# Patient Record
Sex: Male | Born: 1970 | Race: Black or African American | Hispanic: No | Marital: Single | Smoking: Current every day smoker
Health system: Southern US, Community
[De-identification: ages and names within clinical notes are randomized; demographics above are authoritative.]

## PROBLEM LIST (undated history)

## (undated) DIAGNOSIS — J45909 Unspecified asthma, uncomplicated: Secondary | ICD-10-CM

## (undated) DIAGNOSIS — I499 Cardiac arrhythmia, unspecified: Secondary | ICD-10-CM

## (undated) DIAGNOSIS — I4891 Unspecified atrial fibrillation: Secondary | ICD-10-CM

## (undated) DIAGNOSIS — J9819 Other pulmonary collapse: Secondary | ICD-10-CM

## (undated) DIAGNOSIS — R319 Hematuria, unspecified: Secondary | ICD-10-CM

## (undated) DIAGNOSIS — Z87442 Personal history of urinary calculi: Secondary | ICD-10-CM

## (undated) DIAGNOSIS — N200 Calculus of kidney: Secondary | ICD-10-CM

## (undated) DIAGNOSIS — J189 Pneumonia, unspecified organism: Secondary | ICD-10-CM

## (undated) HISTORY — PX: CHEST TUBE INSERTION: SHX231

## (undated) HISTORY — DX: Hematuria, unspecified: R31.9

## (undated) HISTORY — DX: Calculus of kidney: N20.0

---

## 2003-05-06 ENCOUNTER — Emergency Department (HOSPITAL_COMMUNITY): Admission: EM | Admit: 2003-05-06 | Discharge: 2003-05-06 | Payer: Self-pay | Admitting: Emergency Medicine

## 2003-12-01 ENCOUNTER — Emergency Department (HOSPITAL_COMMUNITY): Admission: EM | Admit: 2003-12-01 | Discharge: 2003-12-01 | Payer: Self-pay | Admitting: Emergency Medicine

## 2008-08-30 ENCOUNTER — Emergency Department (HOSPITAL_COMMUNITY): Admission: EM | Admit: 2008-08-30 | Discharge: 2008-08-30 | Payer: Self-pay | Admitting: Emergency Medicine

## 2012-01-08 ENCOUNTER — Encounter (HOSPITAL_COMMUNITY): Payer: Self-pay | Admitting: *Deleted

## 2012-01-08 ENCOUNTER — Emergency Department (HOSPITAL_COMMUNITY)
Admission: EM | Admit: 2012-01-08 | Discharge: 2012-01-08 | Disposition: A | Discharge status: Home / Self Care | Payer: Self-pay | Attending: Emergency Medicine | Admitting: Emergency Medicine

## 2012-01-08 DIAGNOSIS — F17200 Nicotine dependence, unspecified, uncomplicated: Secondary | ICD-10-CM | POA: Insufficient documentation

## 2012-01-08 DIAGNOSIS — R1030 Lower abdominal pain, unspecified: Secondary | ICD-10-CM

## 2012-01-08 DIAGNOSIS — R109 Unspecified abdominal pain: Secondary | ICD-10-CM | POA: Insufficient documentation

## 2012-01-08 HISTORY — DX: Pneumonia, unspecified organism: J18.9

## 2012-01-08 LAB — URINALYSIS, ROUTINE W REFLEX MICROSCOPIC
Glucose, UA: NEGATIVE mg/dL
Ketones, ur: NEGATIVE mg/dL
Leukocytes, UA: NEGATIVE
Nitrite: NEGATIVE
Protein, ur: NEGATIVE mg/dL
Urobilinogen, UA: 0.2 mg/dL (ref 0.0–1.0)

## 2012-01-08 MED ORDER — OXYCODONE-ACETAMINOPHEN 5-325 MG PO TABS: 1.0000 | ORAL_TABLET | ORAL | Status: DC | PRN

## 2012-01-08 NOTE — ED Notes (Signed)
Discharge instructions reviewed with pt, questions answered. Pt verbalized understanding.  

## 2012-01-08 NOTE — ED Notes (Signed)
Pt has had generalized groin pain x 1 month, denies swelling but said tenderness varies day by day.

## 2012-01-08 NOTE — ED Notes (Signed)
MD at bedside. 

## 2012-01-08 NOTE — ED Provider Notes (Signed)
History   This chart was scribed for Joe Gaskins, MD by Charolett Bumpers . The patient was seen in room APA19/APA19. Patient's care was started at 1007.   CSN: [id number] Arrival date & time 01/08/12  1007  First MD Initiated Contact with Patient 01/08/12 1046      Chief Complaint  Patient presents with  . Groin Pain    The history is provided by the patient. No language interpreter was used.   Joe [name] is a 41 y.o. male who presents to the Emergency Department complaining of constant, moderate achy groin pain with an onset of 2-3 days ago. He reports minimal associated back pain. He reports urinary urgency prior to onset of symptoms. He states his pain is aggravated with exertion, cough and laying down. He denies any known injuries or strains. He denies any fevers, penile discharge, testicular pain, dysuria, increased frequency, nocturia. He denies any numbness, weakness or tingling or extremities. He denies any urinary or bowel incontinence. He denies any h/o hernias.  No diarrhea reported.  No change in bowel habits  Past Medical History  Diagnosis Date  . Pneumonia     History reviewed. No pertinent past surgical history.  History reviewed. No pertinent family history.  History  Substance Use Topics  . Smoking status: Heavy Tobacco Smoker -- 0.5 packs/day    Types: Cigarettes  . Smokeless tobacco: Not on file  . Alcohol Use: Yes     social      Review of Systems  Constitutional: Negative for fever.  Genitourinary: Positive for urgency. Negative for dysuria, frequency, discharge and testicular pain.       Groin pain. No nocturia.   Musculoskeletal: Positive for back pain.  Neurological: Negative for weakness and numbness.  All other systems reviewed and are negative.    Allergies  Review of patient's allergies indicates no known allergies.  Home Medications  No current outpatient prescriptions on file.  BP 133/88  Pulse 81  Temp 98 F  (36.7 C) (Oral)  Resp 17  Ht 6\' 1"  (1.854 m)  Wt 220 lb (99.791 kg)  BMI 29.03 kg/m2  SpO2 100%  Physical Exam CONSTITUTIONAL: Well developed/well nourished HEAD AND FACE: Normocephalic/atraumatic EYES: EOMI/PERRL ENMT: Mucous membranes moist NECK: supple no meningeal signs SPINE:entire spine nontender CV: S1/S2 noted, no murmurs/rubs/gallops noted LUNGS: Lungs are clear to auscultation bilaterally, no apparent distress ABDOMEN: soft, nontender, no rebound or guarding GU:no cva tenderness, no hernia, no testicular tenderness, no erythema on exam with chaperone present.  NEURO: Pt is awake/alert, moves all extremitiesx4, no focal motor deficits to the lower extremities EXTREMITIES: pulses normal, full ROM, no deformity, no tenderness/erythema to inner thighs.  No edema noted SKIN: warm, color normal PSYCH: no abnormalities of mood noted  ED Course  Procedures  DIAGNOSTIC STUDIES: Oxygen Saturation is 100% on room air, normal by my interpretation.    COORDINATION OF CARE:  11:00-Discussed planned course of treatment with the patient including a UA, who is agreeable at this time.    Labs Reviewed  URINALYSIS, ROUTINE W REFLEX MICROSCOPIC - Abnormal; Notable for the following:    Specific Gravity, Urine >1.030 (*)     All other components within normal limits  URINE CULTURE    Pt well appearing U/a negative He has no signs of hernia/torsion/epidimytis  No abd/back pain on my exam Stable for d/c home MDM  Nursing notes including past medical history and social history reviewed and considered in documentation Labs/vital reviewed and  considered    I personally performed the services described in this documentation, which was scribed in my presence. The recorded information has been reviewed and considered.         Joe Gaskins, MD 01/08/12 [phone]

## 2012-01-09 LAB — URINE CULTURE: Colony Count: NO GROWTH

## 2012-10-16 ENCOUNTER — Emergency Department: Payer: Self-pay | Admitting: Internal Medicine

## 2012-10-16 LAB — URINALYSIS, COMPLETE
Leukocyte Esterase: NEGATIVE
Ph: 7 (ref 4.5–8.0)
Protein: NEGATIVE
RBC,UR: <1 /HPF (ref 0–5)
Specific Gravity: 1.016 (ref 1.003–1.030)

## 2012-10-16 LAB — COMPREHENSIVE METABOLIC PANEL
Albumin: 3.8 g/dL (ref 3.4–5.0)
Alkaline Phosphatase: 76 U/L (ref 50–136)
Anion Gap: 3 — ABNORMAL LOW (ref 7–16)
Bilirubin,Total: 0.2 mg/dL (ref 0.2–1.0)
Calcium, Total: 8.9 mg/dL (ref 8.5–10.1)
Potassium: 4.2 mmol/L (ref 3.5–5.1)
Total Protein: 7.2 g/dL (ref 6.4–8.2)

## 2012-10-16 LAB — DRUG SCREEN, URINE
Amphetamines, Ur Screen: NEGATIVE (ref ?–1000)
Cannabinoid 50 Ng, Ur ~~LOC~~: NEGATIVE (ref ?–50)
Cocaine Metabolite,Ur ~~LOC~~: POSITIVE (ref ?–300)
MDMA (Ecstasy)Ur Screen: NEGATIVE (ref ?–500)
Methadone, Ur Screen: NEGATIVE (ref ?–300)
Opiate, Ur Screen: NEGATIVE (ref ?–300)
Tricyclic, Ur Screen: NEGATIVE (ref ?–1000)

## 2012-10-16 LAB — CBC
HGB: 14.2 g/dL (ref 13.0–18.0)
MCH: 28.5 pg (ref 26.0–34.0)
MCHC: 33 g/dL (ref 32.0–36.0)
MCV: 86 fL (ref 80–100)
Platelet: 298 10*3/uL (ref 150–440)
WBC: 10.3 10*3/uL (ref 3.8–10.6)

## 2012-10-16 LAB — ETHANOL: Ethanol %: <0.003 % (ref 0.000–0.080)

## 2012-10-16 LAB — ACETAMINOPHEN LEVEL: Acetaminophen: <2 ug/mL

## 2014-08-12 ENCOUNTER — Emergency Department (HOSPITAL_COMMUNITY): Payer: No Typology Code available for payment source

## 2014-08-12 ENCOUNTER — Encounter (HOSPITAL_COMMUNITY): Payer: Self-pay | Admitting: *Deleted

## 2014-08-12 ENCOUNTER — Emergency Department (HOSPITAL_COMMUNITY)
Admission: EM | Admit: 2014-08-12 | Discharge: 2014-08-13 | Disposition: A | Discharge status: Home / Self Care | Payer: No Typology Code available for payment source | Attending: Emergency Medicine | Admitting: Emergency Medicine

## 2014-08-12 DIAGNOSIS — Z8701 Personal history of pneumonia (recurrent): Secondary | ICD-10-CM | POA: Diagnosis not present

## 2014-08-12 DIAGNOSIS — Y92410 Unspecified street and highway as the place of occurrence of the external cause: Secondary | ICD-10-CM | POA: Insufficient documentation

## 2014-08-12 DIAGNOSIS — Y998 Other external cause status: Secondary | ICD-10-CM | POA: Insufficient documentation

## 2014-08-12 DIAGNOSIS — S139XXA Sprain of joints and ligaments of unspecified parts of neck, initial encounter: Secondary | ICD-10-CM

## 2014-08-12 DIAGNOSIS — Y9389 Activity, other specified: Secondary | ICD-10-CM | POA: Diagnosis not present

## 2014-08-12 DIAGNOSIS — S01311A Laceration without foreign body of right ear, initial encounter: Secondary | ICD-10-CM | POA: Diagnosis not present

## 2014-08-12 DIAGNOSIS — S0990XA Unspecified injury of head, initial encounter: Secondary | ICD-10-CM | POA: Diagnosis present

## 2014-08-12 DIAGNOSIS — S134XXA Sprain of ligaments of cervical spine, initial encounter: Secondary | ICD-10-CM | POA: Insufficient documentation

## 2014-08-12 DIAGNOSIS — M542 Cervicalgia: Secondary | ICD-10-CM

## 2014-08-12 MED ORDER — OXYCODONE-ACETAMINOPHEN 5-325 MG PO TABS
1.0000 | ORAL_TABLET | Freq: Once | ORAL | Status: AC
Start: 2014-08-12 — End: 2014-08-12
  Administered 2014-08-12: 1 via ORAL
  Filled 2014-08-12: qty 1

## 2014-08-12 MED ORDER — LIDOCAINE HCL (PF) 1 % IJ SOLN
5.0000 mL | Freq: Once | INTRAMUSCULAR | Status: AC
  Administered 2014-08-12: 5 mL
  Filled 2014-08-12: qty 5

## 2014-08-12 MED ORDER — IBUPROFEN 800 MG PO TABS
800.0000 mg | ORAL_TABLET | Freq: Once | ORAL | Status: AC
  Administered 2014-08-12: 800 mg via ORAL
  Filled 2014-08-12: qty 1
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
## 2014-08-12 NOTE — Discharge Instructions (Signed)
You have had a head injury which does not appear to require admission at this time. A concussion is a state of changed mental ability from trauma.  SEEK IMMEDIATE MEDICAL ATTENTION IF: There is confusion or drowsiness (although children frequently become drowsy after injury).  You cannot awaken the injured person.  There is nausea (feeling sick to your stomach) or continued, forceful vomiting.  You notice dizziness or unsteadiness which is getting worse, or inability to walk.  You have convulsions or unconsciousness.  You experience severe, persistent headaches not relieved by Tylenol. (Do not take aspirin as this impairs clotting abilities). Take other pain medications only as directed.  You cannot use arms or legs normally.  There are changes in pupil sizes. (This is the black center in the colored part of the eye)  There is clear or bloody discharge from the nose or ears.  Change in speech, vision, swallowing, or understanding.  Localized weakness, numbness, tingling, or change in bowel or bladder control.  You have neck pain, possibly from a cervical strain and/or pinched nerve.   SEEK IMMEDIATE MEDICAL ATTENTION IF: You develop difficulties swallowing or breathing.  You have new or worse numbness, weakness, tingling, or movement problems in your arms or legs.  You develop increasing pain which is uncontrolled with medications.  You have change in bowel or bladder function, or other concerns.  Auricle Injuries You have an injury to your external ear (auricle). The ear has a layer of skin over cartilage. A cut or bruise to the ear can separate the skin from the cartilage underneath. This can cause problems with healing if blood gathers between the skin and the cartilage. Permanent damage to the ear may result if the excess blood is not drained within 1 to 2 days. Stitches, tape, or tissue glue may be used to close a cut. A pressure bandage may be used to keep blood from forming under the  injured skin. If there is a lot of blood present (hematoma), a needle aspiration may be needed to remove it. You must have the ear checked within 1 to 2 days or as directed if you have had this type of injury. This is see if the blood has accumulated again. Call your caregiver for a follow-up exam as recommended.  SEEK IMMEDIATE MEDICAL CARE IF:  You develop severe pain.  You develop a fever or pus like drainage.  You have increased hearing loss or other problems. MAKE SURE YOU:   Understand these instructions.  Will watch your condition.  Will get help right away if you are not doing well or get worse. Document Released: 03/07/2005 Document Revised: 05/30/2011 Document Reviewed: 08/24/2006 [facility] Patient Information 2015 [place], [place]. This information is not intended to replace advice given to you by your health care provider. Make sure you discuss any questions you have with your health care provider.
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
## 2014-08-12 NOTE — ED Notes (Signed)
Dressing placed to laceration on right ear, pt tolerated well,

## 2014-08-15 NOTE — ED Notes (Signed)
Pt returned today wanting a work note to be out of work until his stitches come out. Pt made aware I could not give him a note for 7 - 10 days. Pt was given a note to return on 08/14/14

## 2016-01-21 ENCOUNTER — Emergency Department (HOSPITAL_COMMUNITY)
Admission: EM | Admit: 2016-01-21 | Discharge: 2016-01-21 | Disposition: A | Discharge status: Home / Self Care | Payer: Self-pay | Attending: Emergency Medicine | Admitting: Emergency Medicine

## 2016-01-21 ENCOUNTER — Emergency Department (HOSPITAL_COMMUNITY): Payer: Self-pay

## 2016-01-21 ENCOUNTER — Encounter (HOSPITAL_COMMUNITY): Payer: Self-pay

## 2016-01-21 DIAGNOSIS — I480 Paroxysmal atrial fibrillation: Secondary | ICD-10-CM | POA: Insufficient documentation

## 2016-01-21 DIAGNOSIS — I4891 Unspecified atrial fibrillation: Secondary | ICD-10-CM

## 2016-01-21 DIAGNOSIS — F17210 Nicotine dependence, cigarettes, uncomplicated: Secondary | ICD-10-CM | POA: Insufficient documentation

## 2016-01-21 LAB — TROPONIN I: Troponin I: <0.03 ng/mL (ref <0.03)

## 2016-01-21 LAB — RAPID URINE DRUG SCREEN, HOSP PERFORMED
AMPHETAMINES: NOT DETECTED
BARBITURATES: NOT DETECTED
Benzodiazepines: NOT DETECTED
Cocaine: NOT DETECTED
OPIATES: NOT DETECTED
TETRAHYDROCANNABINOL: NOT DETECTED

## 2016-01-21 LAB — BASIC METABOLIC PANEL
ANION GAP: 5 (ref 5–15)
BUN: 15 mg/dL (ref 6–20)
CALCIUM: 8.5 mg/dL — AB (ref 8.9–10.3)
CHLORIDE: 108 mmol/L (ref 101–111)
CO2: 25 mmol/L (ref 22–32)
Creatinine, Ser: 0.85 mg/dL (ref 0.61–1.24)
GFR calc non Af Amer: >60 mL/min (ref >60)
GLUCOSE: 108 mg/dL — AB (ref 65–99)
Potassium: 4 mmol/L (ref 3.5–5.1)
Sodium: 138 mmol/L (ref 135–145)

## 2016-01-21 LAB — CBC WITH DIFFERENTIAL/PLATELET
Basophils Absolute: 0 10*3/uL (ref 0.0–0.1)
Basophils Relative: 0 %
Eosinophils Absolute: 0.4 10*3/uL (ref 0.0–0.7)
Eosinophils Relative: 4 %
HEMATOCRIT: 42.9 % (ref 39.0–52.0)
HEMOGLOBIN: 14.4 g/dL (ref 13.0–17.0)
LYMPHS ABS: 3.6 10*3/uL (ref 0.7–4.0)
LYMPHS PCT: 29 %
MCH: 28.9 pg (ref 26.0–34.0)
MCHC: 33.6 g/dL (ref 30.0–36.0)
MCV: 86.1 fL (ref 78.0–100.0)
MONO ABS: 1.1 10*3/uL — AB (ref 0.1–1.0)
MONOS PCT: 9 %
NEUTROS ABS: 7.2 10*3/uL (ref 1.7–7.7)
NEUTROS PCT: 58 %
Platelets: 299 10*3/uL (ref 150–400)
RBC: 4.98 MIL/uL (ref 4.22–5.81)
RDW: 14.9 % (ref 11.5–15.5)
WBC: 12.4 10*3/uL — ABNORMAL HIGH (ref 4.0–10.5)

## 2016-01-21 LAB — URINALYSIS, ROUTINE W REFLEX MICROSCOPIC
BILIRUBIN URINE: NEGATIVE
GLUCOSE, UA: NEGATIVE mg/dL
HGB URINE DIPSTICK: NEGATIVE
Ketones, ur: NEGATIVE mg/dL
Leukocytes, UA: NEGATIVE
Nitrite: NEGATIVE
PH: 5.5 (ref 5.0–8.0)
Protein, ur: NEGATIVE mg/dL
SPECIFIC GRAVITY, URINE: 1.025 (ref 1.005–1.030)

## 2016-01-21 LAB — TSH: TSH: 0.986 u[IU]/mL (ref 0.350–4.500)

## 2016-01-21 LAB — MAGNESIUM: MAGNESIUM: 1.7 mg/dL (ref 1.7–2.4)

## 2016-01-21 MED ORDER — DILTIAZEM HCL 30 MG PO TABS
60.0000 mg | ORAL_TABLET | Freq: Once | ORAL | Status: AC
  Administered 2016-01-21: 60 mg via ORAL
  Filled 2016-01-21: qty 2

## 2016-01-21 MED ORDER — METOPROLOL TARTRATE 50 MG PO TABS: 50.0000 mg | ORAL_TABLET | Freq: Two times a day (BID) | ORAL | 0 refills | Status: DC

## 2016-01-21 MED ORDER — ASPIRIN 81 MG PO CHEW: 81.0000 mg | CHEWABLE_TABLET | Freq: Every day | ORAL | 0 refills | Status: DC

## 2016-01-21 MED ORDER — DILTIAZEM HCL 25 MG/5ML IV SOLN
10.0000 mg | Freq: Once | INTRAVENOUS | Status: AC
  Administered 2016-01-21: 10 mg via INTRAVENOUS
  Filled 2016-01-21: qty 5

## 2016-01-21 MED ORDER — METOPROLOL TARTRATE 25 MG PO TABS
25.0000 mg | ORAL_TABLET | Freq: Once | ORAL | Status: AC
  Administered 2016-01-21: 25 mg via ORAL
  Filled 2016-01-21: qty 1

## 2016-01-21 MED ORDER — SODIUM CHLORIDE 0.9 % IV BOLUS (SEPSIS)
500.0000 mL | Freq: Once | INTRAVENOUS | Status: AC
  Administered 2016-01-21: 500 mL via INTRAVENOUS

## 2016-01-21 NOTE — ED Notes (Signed)
Spoke with ER physician and plan to give 5 mg cardizem and then recheck blood pressure and if blood pressure ok repeat 5 mg cardizem.

## 2016-01-21 NOTE — ED Triage Notes (Signed)
Patient states he was eating a donut and drinking milk last night when he felt like his heart was racing. Sx have not resolved. Denies chest pain.

## 2016-01-21 NOTE — ED Notes (Signed)
Appt. Given by Cardiology, after Pt was discharged,  to see Jory Sims on Nov. 16, 2017 at 2:10pm.  Called his home and left message. Asked family to have him call back here or call Cardiology for confimation.

## 2016-01-21 NOTE — Discharge Instructions (Signed)
Stop drinking alcohol. You'll need to take an 81 mg aspirin once daily. Call Dr. branch his office to arrange a follow-up appointment I also listed information for a GI physician to follow-up

## 2016-01-21 NOTE — Consult Note (Signed)
Primary cardiologist:N/A Consulting cardiologist:Dr Carlyle Dolly Requesting physician: Dr Laverta [place] Indication: new onset afib with RVR  Clinical Summary Joe [name] is a 45 y.o.male with no significant past medical history presents to ER with palpitations. In er found to be in afib with RVR, a new diagnosis for the patient. In Er received IV diltiazem with improved rates, started on dilt 60mg  oral. Reports onset of palpitations, lightheadness. No chest pain or SOB. Reports fairly regular EtOH use, drank 1/2 pint of vodka yesterday.     K 4.0, Cr 0.85, Hgb 14.4, Plt 299, trop neg CXR no acute process EKG afib   No Known Allergies  Medications Scheduled Medications:    Infusions:    PRN Medications:     Past Medical History:  Diagnosis Date  . Pneumonia     Past Surgical History:  Procedure Laterality Date  . CHEST TUBE INSERTION      History reviewed. No pertinent family history.  Social History Joe [name] reports that he has been smoking Cigarettes.  He has been smoking about 0.50 packs per day. He does not have any smokeless tobacco history on file. Joe [name] reports that he drinks alcohol.  Review of Systems CONSTITUTIONAL: No weight loss, fever, chills, weakness or fatigue.  HEENT: Eyes: No visual loss, blurred vision, double vision or yellow sclerae. No hearing loss, sneezing, congestion, runny nose or sore throat.  SKIN: No rash or itching.  CARDIOVASCULAR: per hpi RESPIRATORY: No shortness of breath, cough or sputum.  GASTROINTESTINAL: No anorexia, nausea, vomiting or diarrhea. No abdominal pain or blood.  GENITOURINARY: no polyuria, no dysuria NEUROLOGICAL: No headache, dizziness, syncope, paralysis, ataxia, numbness or tingling in the extremities. No change in bowel or bladder control.  MUSCULOSKELETAL: No muscle, back pain, joint pain or stiffness.  HEMATOLOGIC: No anemia, bleeding or bruising.  LYMPHATICS: No enlarged nodes. No history of  splenectomy.  PSYCHIATRIC: No history of depression or anxiety.      Physical Examination Blood pressure 99/75, pulse 65, temperature 97.7 F (36.5 C), temperature source Oral, resp. rate 11, height 6\' 1"  (1.854 m), weight 225 lb (102.1 kg), SpO2 100 %. No intake or output data in the 24 hours ending 01/21/16 1120  HEENT: sclera clear, throat clear  Cardiovascular: irreg, rate 100, no m/r/g, no jvd  Respiratory: CTAB  GI: abdomen soft, NT, ND  MSK: no LE edema  Neuro: no focal deficits  Psych: appropriate affect   Lab Results  Basic Metabolic Panel:  Recent Labs Lab 01/21/16 0853  NA 138  K 4.0  CL 108  CO2 25  GLUCOSE 108*  BUN 15  CREATININE 0.85  CALCIUM 8.5*    Liver Function Tests: No results for input(s): AST, ALT, ALKPHOS, BILITOT, PROT, ALBUMIN in the last 168 hours.  CBC:  Recent Labs Lab 01/21/16 0853  WBC 12.4*  NEUTROABS 7.2  HGB 14.4  HCT 42.9  MCV 86.1  PLT 299    Cardiac Enzymes:  Recent Labs Lab 01/21/16 0853  TROPONINI <0.03    BNP: Invalid input(s): POCBNP     Impression/Recommendations 1. Afib - presented with symptomatic afib with RVR, a new diagnosis for patient - rates reasonably controlled after IV and oral dilt in ER, he is not overally symptomatic and would be ok to be discharged from ER - would start lopressor 50mg  po bid, can take additional 50mg  prn palpitations. -  Counseled on cutting back or stopping drinking, this may be contributing to his afib - CHADS2Vasc score  is 0, please start aspirin 81mg  daily - he currently does not have insurance. Hold on ordering echo at this time, reassess at outpatient f/u  2. Bright red per rectum - reports several week history of intermittent blood with BM's, often happens when he drinks - will need outpatient GI referral - if considered for cardioversion in the future, he would have to be able to be on anticoagulation at least for a few weeks even despite his low  CHAD2Vasc score.     Please start lopressor 50mg  bid, aspirin 81mg . Please refer to GI for GI bleed. We will arrange outpatient cardiology f/u. [place] for Er discharge.  - f/u thyroid tests as outpatient.   Carlyle Dolly, M.D.

## 2016-01-21 NOTE — ED Notes (Signed)
Dr. Harl Bowie in to see pt. Joe [name].

## 2016-01-21 NOTE — ED Provider Notes (Signed)
[facility] DEPT Provider Note  Attestation:  Medical screening examination/treatment/procedure(s) were conducted as a shared visit with non-physician practitioner(s) and myself.  I personally evaluated the patient during the encounter.   EKG Interpretation  Date/Time:  Thursday January 21 2016 10:44:10 EDT Ventricular Rate:  115 PR Interval:    QRS Duration: 79 QT Interval:  304 QTC Calculation: 421 R Axis:   92 Text Interpretation:  Right and left arm electrode reversal, interpretation assumes no reversal Atrial fibrillation Borderline right axis deviation Borderline T abnormalities, inferior leads No STEMI. Similar to prior.  Confirmed by LONG MD, JOSHUA [phone]) on 01/21/2016 11:18:21 AM      Patient presents to the ED in new onset a-fib with RVR. Responded to multiple diltiazem boluses and IVF. Cardiology consulted who saw the patient in the ED. Recommends anticoagulation, which was started, and follow up in the office this week. Patient's BP was initially slightly low but improved with IVF and rate control.   CRITICAL CARE Performed by: Margette Fast Total critical care time: 30 minutes Critical care time was exclusive of separately billable procedures and treating other patients. Critical care was necessary to treat or prevent imminent or life-threatening deterioration. Critical care was time spent personally by me on the following activities: development of treatment plan with patient and/or surrogate as well as nursing, discussions with consultants, evaluation of patient's response to treatment, examination of patient, obtaining history from patient or surrogate, ordering and performing treatments and interventions, ordering and review of laboratory studies, ordering and review of radiographic studies, pulse oximetry and re-evaluation of patient's condition.  Nanda Quinton, MD Emergency Medicine    CSN: [id number] Arrival date & time: 01/21/16  [id number]     History   Chief  Complaint Chief Complaint  Patient presents with  . Palpitations    HPI Joe [name] is a 45 y.o. male.  HPI  Joe [name] is a 45 y.o. male who presents to the Emergency Department complaining of sudden onset of palpitations.  He states that he was drinking milk and eating a doughnut around midnight when he began experiencing a "racing and fluttering" sensation of his heart.  He Would take several deep breaths trying to slow his heart exam but was unsuccessful. He states occasionally he would feel that his heart was beating too fast and into slow. He describes the sensation of feeling "jittery" he denies pain or shortness of breath. He states he's never had an experience like this before. He denies any new medications or recent drug use although he does admit to some alcohol use last evening. He also denies previous cardiac history. Does admit to smoking cigarettes.   Past Medical History:  Diagnosis Date  . Pneumonia     There are no active problems to display for this patient.   Past Surgical History:  Procedure Laterality Date  . CHEST TUBE INSERTION         Home Medications    Prior to Admission medications   Not on File    Family History History reviewed. No pertinent family history.  Social History Social History  Substance Use Topics  . Smoking status: Heavy Tobacco Smoker    Packs/day: 0.50    Types: Cigarettes  . Smokeless tobacco: Not on file  . Alcohol use Yes     Comment: OCC     Allergies   Review of patient's allergies indicates no known allergies.   Review of Systems Review of Systems  Constitutional: Negative for appetite  change, chills and fever.  Respiratory: Negative for chest tightness, shortness of breath and wheezing.   Cardiovascular: Positive for palpitations. Negative for chest pain and leg swelling.  Gastrointestinal: Negative for abdominal pain, nausea and vomiting.  Genitourinary: Negative for dysuria.   Musculoskeletal: Negative for arthralgias, back pain, myalgias and neck pain.  Skin: Negative for rash.  Neurological: Negative for dizziness, syncope, speech difficulty, weakness, light-headedness, numbness and headaches.  Psychiatric/Behavioral: Negative for behavioral problems and confusion.     Physical Exam Updated Vital Signs BP 103/70   Pulse 89   Temp 97.7 F (36.5 C) (Oral)   Resp 10   Ht 6\' 1"  (1.854 m)   Wt 102.1 kg   SpO2 98%   BMI 29.69 kg/m   Physical Exam  Constitutional: He is oriented to person, place, and time. He appears well-developed and well-nourished. No distress.  HENT:  Head: Atraumatic.  Mouth/Throat: Oropharynx is clear and moist.  Eyes: Conjunctivae and EOM are normal. Pupils are equal, round, and reactive to light.  Neck: Normal range of motion. No JVD present.  Cardiovascular: Intact distal pulses and normal pulses.  An irregularly irregular rhythm present. Tachycardia present.   Pulmonary/Chest: Effort normal and breath sounds normal. No respiratory distress. He exhibits no tenderness.  Abdominal: Soft. He exhibits no distension and no mass. There is no tenderness. There is no guarding.  Musculoskeletal: Normal range of motion.  Neurological: He is alert and oriented to person, place, and time.  Skin: Skin is warm and dry. No rash noted.  Psychiatric: He has a normal mood and affect.     ED Treatments / Results  Labs (all labs ordered are listed, but only abnormal results are displayed) Labs Reviewed  BASIC METABOLIC PANEL - Abnormal; Notable for the following:       Result Value   Glucose, Bld 108 (*)    Calcium 8.5 (*)    All other components within normal limits  CBC WITH DIFFERENTIAL/PLATELET - Abnormal; Notable for the following:    WBC 12.4 (*)    Monocytes Absolute 1.1 (*)    All other components within normal limits  TROPONIN I  URINALYSIS, ROUTINE W REFLEX MICROSCOPIC (NOT AT [facility])  RAPID URINE DRUG SCREEN, HOSP PERFORMED   TSH  MAGNESIUM    EKG  EKG Interpretation  Date/Time:  Thursday January 21 2016 10:44:10 EDT Ventricular Rate:  115 PR Interval:    QRS Duration: 79 QT Interval:  304 QTC Calculation: 421 R Axis:   92 Text Interpretation:  Right and left arm electrode reversal, interpretation assumes no reversal Atrial fibrillation Borderline right axis deviation Borderline T abnormalities, inferior leads No STEMI. Similar to prior.  Confirmed by LONG MD, JOSHUA [phone]) on 01/21/2016 11:18:21 AM       Radiology No results found.  Procedures Procedures (including critical care time)  Medications Ordered in ED Medications  diltiazem (CARDIZEM) injection 10 mg ( Intravenous Canceled Entry 01/21/16 1100)  sodium chloride 0.9 % bolus 500 mL (0 mLs Intravenous Stopped 01/21/16 0855)  diltiazem (CARDIZEM) tablet 60 mg (60 mg Oral Given 01/21/16 0935)  metoprolol tartrate (LOPRESSOR) tablet 25 mg (25 mg Oral Given 01/21/16 1222)     Initial Impression / Assessment and Plan / ED Course  I have reviewed the triage vital signs and the nursing notes.  Pertinent labs & imaging results that were available during my care of the patient were reviewed by me and considered in my medical decision making (see chart for details).  Clinical Course   0845 Pt is well appearing, no distress noted.  Pt also seen by Dr.Long and care plan discussed. Will order labs, IVF's and Cardizem.  Pt appears stable, no indication for cardioversion at present.  0915  Pt resting, HR improving.  No distress  1100  Consulted cardiology, Dr. Harl Bowie, who agrees to see pt in ED.  1135 Dr. Harl Bowie evaluated pt in the ED, felt that pt is stable for discharge.  Will start 81 mg ASA and Lopressor 50 mg BID, recommended by Dr. Harl Bowie and pt agrees to f/u in his office. Dr. Harl Bowie also recommended GI referral.  Pt mentioned intermittent rectal bleeding associated with his alcohol intake which the pt did not mention to me during his  evaluation.    Final Clinical Impressions(s) / ED Diagnoses   Final diagnoses:  Atrial fibrillation with RVR [facility])    New Prescriptions New Prescriptions   No medications on file     Kem Parkinson, PA-C 01/22/16 0826    Margette Fast, MD 01/27/16 [phone]

## 2016-02-04 ENCOUNTER — Encounter: Payer: Self-pay | Admitting: Adult Health

## 2016-02-04 NOTE — Progress Notes (Deleted)
Cardiology Office Note   Date:  02/04/2016   ID:  RYOT CORNELISON, DOB Oct 08, 1970, MRN [id number]  PCP:  No PCP Per Patient  Cardiologist: Cloria Spring, NP   No chief complaint on file.     History of Present Illness: Joe [name] is a 45 y.o. male who presents for ongoing assessment and management of atrial fibrillation with RVR, the patient was initially seen on consultation on ER visit for same. The patient was started on Lopressor 50 mg twice a day, and taken additional 50 mg for palpitation. The patient admitted to alcohol abuse, drinking heavily with vodka. The patient also had complaints of bright red blood from rectum. He was referred to GI. Consideration for follow-up echocardiogram will be made on this appointment. Review of labs from the emergency room did not show evidence of anemia, hemoglobin 14.4, hematocrit 42.9. Magnesium was 1.7.    Past Medical History:  Diagnosis Date  . Pneumonia     Past Surgical History:  Procedure Laterality Date  . CHEST TUBE INSERTION       Current Outpatient Prescriptions  Medication Sig Dispense Refill  . aspirin 81 MG chewable tablet Chew 1 tablet (81 mg total) by mouth daily. 30 tablet 0  . metoprolol (LOPRESSOR) 50 MG tablet Take 1 tablet (50 mg total) by mouth 2 (two) times daily. 60 tablet 0   No current facility-administered medications for this visit.     Allergies:   Patient has no known allergies.    Social History:  The patient  reports that he has been smoking Cigarettes.  He has been smoking about 0.50 packs per day. He does not have any smokeless tobacco history on file. He reports that he drinks alcohol. He reports that he does not use drugs.   Family History:  The patient's family history is not on file.    ROS: All other systems are reviewed and negative. Unless otherwise mentioned in H&P    PHYSICAL EXAM: VS:  There were no vitals taken for this visit. , BMI There is no height or weight  on file to calculate BMI. GEN: Well nourished, well developed, in no acute distress  HEENT: normal  Neck: no JVD, carotid bruits, or masses Cardiac: ***RRR; no murmurs, rubs, or gallops,no edema  Respiratory:  clear to auscultation bilaterally, normal work of breathing GI: soft, nontender, nondistended, + BS MS: no deformity or atrophy  Skin: warm and dry, no rash Neuro:  Strength and sensation are intact Psych: euthymic mood, full affect   EKG:  EKG {ACTION; IS/IS [id number] ordered today. The ekg ordered today demonstrates ***   Recent Labs: 01/21/2016: BUN 15; Creatinine, Ser 0.85; Hemoglobin 14.4; Magnesium 1.7; Platelets 299; Potassium 4.0; Sodium 138; TSH 0.986    Lipid Panel No results found for: CHOL, TRIG, HDL, CHOLHDL, VLDL, LDLCALC, LDLDIRECT    Wt Readings from Last 3 Encounters:  01/21/16 225 lb (102.1 kg)  08/12/14 180 lb (81.6 kg)  01/08/12 220 lb (99.8 kg)      Other studies Reviewed: Additional studies/ records that were reviewed today include: ***. Review of the above records demonstrates: ***   ASSESSMENT AND PLAN:  1.  ***   Current medicines are reviewed at length with the patient today.    Labs/ tests ordered today include: *** No orders of the defined types were placed in this encounter.    Disposition:   FU with *** in {gen number [id number] {TIME; UNITS DAY/WEEK/MONTH:19136}   Signed,  Jory Sims, NP  02/04/2016 6:52 AM    [facility] [address] [place], [place] [address] Phone: [phone]; Fax: [phone]

## 2016-07-17 ENCOUNTER — Encounter (HOSPITAL_COMMUNITY): Payer: Self-pay | Admitting: *Deleted

## 2016-07-17 ENCOUNTER — Emergency Department (HOSPITAL_COMMUNITY)
Admission: EM | Admit: 2016-07-17 | Discharge: 2016-07-17 | Disposition: A | Discharge status: Home / Self Care | Payer: Medicaid Other | Attending: Emergency Medicine | Admitting: Emergency Medicine

## 2016-07-17 DIAGNOSIS — Z79899 Other long term (current) drug therapy: Secondary | ICD-10-CM | POA: Diagnosis not present

## 2016-07-17 DIAGNOSIS — F17210 Nicotine dependence, cigarettes, uncomplicated: Secondary | ICD-10-CM | POA: Insufficient documentation

## 2016-07-17 DIAGNOSIS — Z7982 Long term (current) use of aspirin: Secondary | ICD-10-CM | POA: Insufficient documentation

## 2016-07-17 DIAGNOSIS — L989 Disorder of the skin and subcutaneous tissue, unspecified: Secondary | ICD-10-CM | POA: Diagnosis not present

## 2016-07-17 DIAGNOSIS — K625 Hemorrhage of anus and rectum: Secondary | ICD-10-CM | POA: Diagnosis present

## 2016-07-17 LAB — CBC WITH DIFFERENTIAL/PLATELET
BASOS ABS: 0 10*3/uL (ref 0.0–0.1)
BASOS PCT: 0 %
EOS ABS: 0.4 10*3/uL (ref 0.0–0.7)
Eosinophils Relative: 3 %
HCT: 40.4 % (ref 39.0–52.0)
HEMOGLOBIN: 13.4 g/dL (ref 13.0–17.0)
LYMPHS ABS: 3.3 10*3/uL (ref 0.7–4.0)
LYMPHS PCT: 28 %
MCH: 28.5 pg (ref 26.0–34.0)
MCHC: 33.2 g/dL (ref 30.0–36.0)
MCV: 86 fL (ref 78.0–100.0)
Monocytes Absolute: 1.1 10*3/uL — ABNORMAL HIGH (ref 0.1–1.0)
Monocytes Relative: 9 %
NEUTROS ABS: 7 10*3/uL (ref 1.7–7.7)
Neutrophils Relative %: 60 %
Platelets: 276 10*3/uL (ref 150–400)
RBC: 4.7 MIL/uL (ref 4.22–5.81)
RDW: 14.7 % (ref 11.5–15.5)
WBC: 11.8 10*3/uL — ABNORMAL HIGH (ref 4.0–10.5)

## 2016-07-17 LAB — POC OCCULT BLOOD, ED: Fecal Occult Bld: POSITIVE — AB

## 2016-07-17 NOTE — ED Notes (Signed)
Called lab to check on blood work. Phlebotomy drew blood about 20+ min ago.

## 2016-07-17 NOTE — Discharge Instructions (Signed)
Call Dr.Rourk's office tomorrow to schedule the next available appointment. Tell office staff that you were seen in the emergency department today for rectal bleeding. Return for lightheadedness, fainting or if concern for any reason

## 2016-07-17 NOTE — ED Triage Notes (Signed)
Pt comes in for rectal bleeding starting 3 days ago. Pt states blood was bright red and is now darker. Pt denies any new weakness or shortness of breath. Pt also has a cyst on his back that he wants removed.

## 2016-07-17 NOTE — ED Provider Notes (Signed)
[facility] DEPT Provider Note   CSN: [id number] Arrival date & time: 07/17/16  0815  By signing my name below, I, Sonum Patel, attest that this documentation has been prepared under the direction and in the presence of Orlie Dakin, MD. Electronically Signed: Sonum Patel, Education administrator. 07/17/16. 9:02 AM.  History   Chief Complaint Chief Complaint  Patient presents with  . GI Bleeding    The history is provided by the patient. No language interpreter was used.     HPI Comments: Joe [name] is a 46 y.o. male who presents to the Emergency Department complaining of intermittent blood in stool with each BM that began 3 days ago. He states the most recent BM with blood was this morning. He reports similar symptoms about 5 months ago but did not follow up for further evaluation. He denies associated lightheadedness or weight loss. Denies abdominal pain denies vomiting appetite remains good He smokes cigarettes, drinking about 1-2 pint weekly, but denies illicit drug use.   He also complains of a wart to the back that has been present for "months".   Past Medical History:  Diagnosis Date  . Pneumonia     There are no active problems to display for this patient.   Past Surgical History:  Procedure Laterality Date  . CHEST TUBE INSERTION         Home Medications    Prior to Admission medications   Medication Sig Start Date End Date Taking? Authorizing Provider  aspirin 81 MG chewable tablet Chew 1 tablet (81 mg total) by mouth daily. 01/21/16   Tammy Triplett, PA-C  metoprolol (LOPRESSOR) 50 MG tablet Take 1 tablet (50 mg total) by mouth 2 (two) times daily. 01/21/16   Tammy Triplett, PA-C    Family History No family history on file.  Social History Social History  Substance Use Topics  . Smoking status: Heavy Tobacco Smoker    Packs/day: 0.50    Types: Cigarettes  . Smokeless tobacco: Never Used  . Alcohol use Yes     Comment: OCC   One or 2 pints of liquor per  week. No drug use  Allergies   Patient has no known allergies.   Review of Systems Review of Systems  Constitutional: Negative.  Negative for unexpected weight change.  HENT: Negative.   Respiratory: Negative.   Cardiovascular: Negative.   Gastrointestinal: Positive for anal bleeding. Negative for rectal pain.  Musculoskeletal: Negative.   Skin: Negative.        Skin lesion on back  Neurological: Negative.  Negative for light-headedness.  Psychiatric/Behavioral: Negative.   All other systems reviewed and are negative.    Physical Exam Updated Vital Signs BP 138/86 (BP Location: Left Arm)   Pulse 71   Temp 98.1 F (36.7 C) (Oral)   Resp 18   Ht 6\' 1"  (1.854 m)   Wt 227 lb (103 kg)   SpO2 98%   BMI 29.95 kg/m   Physical Exam  Constitutional: He appears well-developed and well-nourished.  HENT:  Head: Normocephalic and atraumatic.  Eyes: Conjunctivae are normal. Pupils are equal, round, and reactive to light.  Neck: Neck supple. No tracheal deviation present. No thyromegaly present.  Cardiovascular: Normal rate and regular rhythm.   No murmur heard. Pulmonary/Chest: Effort normal and breath sounds normal.  Abdominal: Soft. Bowel sounds are normal. He exhibits no distension. There is no tenderness.  Genitourinary: Rectal exam shows guaiac positive stool.  Genitourinary Comments: Normal tone brown stool  Musculoskeletal: Normal range of  motion. He exhibits no edema or tenderness.  Neurological: He is alert. Coordination normal.  Skin: Skin is warm and dry. No rash noted.  Soft and smooth mass at mid back which is not red warm or tender. Normal skin tone color  Psychiatric: He has a normal mood and affect.  Nursing note and vitals reviewed.    ED Treatments / Results  DIAGNOSTIC STUDIES: Oxygen Saturation is 98% on RA, nml by my interpretation.    COORDINATION OF CARE: 9:01 AM Discussed treatment plan with pt at bedside and pt agreed to plan.   Labs (all labs  ordered are listed, but only abnormal results are displayed) Labs Reviewed - No data to display  EKG  EKG Interpretation None      Results for orders placed or performed during the hospital encounter of 07/17/16  CBC with Differential/Platelet  Result Value Ref Range   WBC 11.8 (H) 4.0 - 10.5 K/uL   RBC 4.70 4.22 - 5.81 MIL/uL   Hemoglobin 13.4 13.0 - 17.0 g/dL   HCT 40.4 39.0 - 52.0 %   MCV 86.0 78.0 - 100.0 fL   MCH 28.5 26.0 - 34.0 pg   MCHC 33.2 30.0 - 36.0 g/dL   RDW 14.7 11.5 - 15.5 %   Platelets 276 150 - 400 K/uL   Neutrophils Relative % 60 %   Lymphocytes Relative 28 %   Monocytes Relative 9 %   Eosinophils Relative 3 %   Basophils Relative 0 %   Neutro Abs 7.0 1.7 - 7.7 K/uL   Lymphs Abs 3.3 0.7 - 4.0 K/uL   Monocytes Absolute 1.1 (H) 0.1 - 1.0 K/uL   Eosinophils Absolute 0.4 0.0 - 0.7 K/uL   Basophils Absolute 0.0 0.0 - 0.1 K/uL   WBC Morphology ATYPICAL LYMPHOCYTES   POC occult blood, ED  Result Value Ref Range   Fecal Occult Bld POSITIVE (A) NEGATIVE   No results found.  Radiology No results found.  Procedures Procedures (including critical care time)  Medications Ordered in ED Medications - No data to display    Initial Impression / Assessment and Plan / ED Course  I have reviewed the triage vital signs and the nursing notes.  Pertinent labs & imaging results that were available during my care of the patient were reviewed by me and considered in my medical decision making (see chart for details).     10:30 AM remains asymptomatic. Stable .Plan referral [facility] gastroenterologist  Final Clinical Impressions(s) / ED Diagnoses  Diagnosis rectal bleeding Final diagnoses:  None    New Prescriptions New Prescriptions   No medications on file   I personally performed the services described in this documentation, which was scribed in my presence. The recorded information has been reviewed and considered.     Orlie Dakin, MD 07/17/16  [phone]

## 2016-07-25 ENCOUNTER — Encounter: Payer: Self-pay | Admitting: Internal Medicine

## 2016-08-11 ENCOUNTER — Telehealth: Payer: Self-pay | Admitting: Nurse Practitioner

## 2016-08-11 ENCOUNTER — Encounter: Payer: Self-pay | Admitting: Nurse Practitioner

## 2016-08-11 ENCOUNTER — Ambulatory Visit: Payer: Medicaid Other | Admitting: Nurse Practitioner

## 2016-08-11 NOTE — Telephone Encounter (Signed)
Noted  

## 2016-08-11 NOTE — Telephone Encounter (Signed)
Patient was a no show and letter sent  °

## 2016-08-12 ENCOUNTER — Other Ambulatory Visit: Payer: Self-pay

## 2016-08-12 ENCOUNTER — Ambulatory Visit (INDEPENDENT_AMBULATORY_CARE_PROVIDER_SITE_OTHER): Payer: Medicaid Other | Admitting: Gastroenterology

## 2016-08-12 ENCOUNTER — Encounter: Payer: Self-pay | Admitting: Gastroenterology

## 2016-08-12 DIAGNOSIS — K625 Hemorrhage of anus and rectum: Secondary | ICD-10-CM | POA: Insufficient documentation

## 2016-08-12 MED ORDER — PEG-KCL-NACL-NASULF-NA ASC-C 100 G PO SOLR: 1.0000 | ORAL | 0 refills | Status: DC

## 2016-08-12 NOTE — Assessment & Plan Note (Signed)
46 year old gentleman presenting with intermittent rectal bleeding. No prior colonoscopy. No family history of colon cancer. Recommend colonoscopy in the near future. Plan for deep sedation in the OR given alcohol use.  I have discussed the risks, alternatives, benefits with regards to but not limited to the risk of reaction to medication, bleeding, infection, perforation and the patient is agreeable to proceed. Written consent to be obtained.  Patient also has hematuria and recommended that he follow through with seeing a urologist. Phone number provided.  Patient is concerned about large lesion on the upper back and would like to have it removed. He has contacted dermatology.

## 2016-08-12 NOTE — Patient Instructions (Signed)
1. Colonoscopy as planned. See separate instructions.  2. Please make an appointment with an urologist for blood in the urine. We will provide you with phone numbers. 3. Please be careful with your alcohol intake not to be excessive. Men may be able to process two to three drinks in 24 hours safely but you should limit use to prevent liver disease.

## 2016-08-12 NOTE — Progress Notes (Signed)
Primary Care Physician: Patient, No Pcp Per  Primary Gastroenterologist:  Garfield Cornea, MD   Chief Complaint  Patient presents with  . Rectal Bleeding    went to ER 07/17/16, no bleeding in past 2 wks. Also had blood in urine    HPI: Joe [name] is a 46 y.o. male hereFor further evaluation of recent rectal bleeding. Symptoms have now resolved. He was seen in the ED on April 29 with complaints of 3 days of intermittent bright red blood per rectum with bowel movements. He's had these symptoms in the past. No abdominal pain, melena, constipation, diarrhea, heartburn, dysphagia, unintentional weight loss. He states he was seen in [facility] ED couple months ago with blood in his urine. He was advised to see urologist but he hasn't done so yet.   In the ED back in April, rectal exam showed heme positive stool.   No current outpatient prescriptions on file.   No current facility-administered medications for this visit.     Allergies as of 08/12/2016  . (No Known Allergies)   Past Medical History:  Diagnosis Date  . Hematuria   . Kidney stones   . Pneumonia    Past Surgical History:  Procedure Laterality Date  . CHEST TUBE INSERTION     Family History  Problem Relation Age of Onset  . Colon cancer Neg Hx   . Liver disease Neg Hx    Social History   Social History  . Marital status: Single    Spouse name: N/A  . Number of children: 2  . Years of education: N/A   Occupational History  . unemployed    Social History Main Topics  . Smoking status: Heavy Tobacco Smoker    Packs/day: 0.50    Types: Cigarettes  . Smokeless tobacco: Never Used  . Alcohol use Yes     Comment: socially, on weekends a pint  . Drug use: No  . Sexual activity: Yes   Other Topics Concern  . None   Social History Narrative  . None    ROS:   ROS:  General: Negative for anorexia, weight loss, fever, chills, fatigue, weakness. Eyes: Negative for vision changes.  ENT:  Negative for hoarseness, difficulty swallowing , nasal congestion. CV: Negative for chest pain, angina, palpitations, dyspnea on exertion, peripheral edema.  Respiratory: Negative for dyspnea at rest, dyspnea on exertion, cough, sputum, wheezing.  GI: See history of present illness. GU:  Negative for dysuria, hematuria, urinary incontinence, urinary frequency, nocturnal urination.  MS: Negative for joint pain, low back pain.  Derm: Negative for rash or itching. Complains of marble sized lesion on upper back Neuro: Negative for weakness, abnormal sensation, seizure, frequent headaches, memory loss, confusion.  Psych: Negative for anxiety, depression, suicidal ideation, hallucinations.  Endo: Negative for unusual weight change.  Heme: Negative for bruising or bleeding. Allergy: Negative for rash or hives.   Physical Examination:   BP 137/88   Pulse 82   Temp 97.5 F (36.4 C) (Oral)   Ht 6\' 1"  (1.854 m)   Wt 217 lb 9.6 oz (98.7 kg)   BMI 28.71 kg/m   Physical Examination:   General: Well-nourished, well-developed in no acute distress.  Head: Normocephalic, atraumatic.   Eyes: Conjunctiva pink, no icterus. Mouth: Oropharyngeal mucosa moist and pink , no lesions erythema or exudate. Neck: Supple without thyromegaly, masses, or lymphadenopathy.  Lungs: Clear to auscultation bilaterally.  Heart: Regular rate and rhythm, no murmurs rubs or gallops.  Abdomen: Bowel sounds are normal, nontender, nondistended, no hepatosplenomegaly or masses, no abdominal bruits or    hernia , no rebound or guarding.   Extremities: No lower extremity edema.  Neuro: Alert and oriented x 4 , grossly normal neurologically.  Skin: Warm and dry, no rash or jaundice.  Marble sized lesion on upper back, smooth, penduculated.  Psych: Alert and cooperative, normal mood and affect.         Labs:  Lab Results  Component Value Date   WBC 11.8 (H) 07/17/2016   HGB 13.4 07/17/2016   HCT 40.4 07/17/2016   MCV  86.0 07/17/2016   PLT 276 07/17/2016    Imaging Studies: No results found.

## 2016-08-16 ENCOUNTER — Encounter (HOSPITAL_COMMUNITY): Payer: Self-pay | Admitting: Emergency Medicine

## 2016-08-16 ENCOUNTER — Emergency Department (HOSPITAL_COMMUNITY)
Admission: EM | Admit: 2016-08-16 | Discharge: 2016-08-16 | Disposition: A | Discharge status: Home / Self Care | Payer: Medicaid Other | Attending: Emergency Medicine | Admitting: Emergency Medicine

## 2016-08-16 DIAGNOSIS — F17210 Nicotine dependence, cigarettes, uncomplicated: Secondary | ICD-10-CM | POA: Insufficient documentation

## 2016-08-16 DIAGNOSIS — R42 Dizziness and giddiness: Secondary | ICD-10-CM | POA: Insufficient documentation

## 2016-08-16 DIAGNOSIS — R002 Palpitations: Secondary | ICD-10-CM | POA: Diagnosis not present

## 2016-08-16 DIAGNOSIS — R0602 Shortness of breath: Secondary | ICD-10-CM | POA: Diagnosis not present

## 2016-08-16 LAB — BASIC METABOLIC PANEL
ANION GAP: 7 (ref 5–15)
BUN: 17 mg/dL (ref 6–20)
CALCIUM: 8.7 mg/dL — AB (ref 8.9–10.3)
CO2: 24 mmol/L (ref 22–32)
Chloride: 107 mmol/L (ref 101–111)
Creatinine, Ser: 0.98 mg/dL (ref 0.61–1.24)
GFR calc Af Amer: >60 mL/min (ref >60)
GFR calc non Af Amer: >60 mL/min (ref >60)
GLUCOSE: 92 mg/dL (ref 65–99)
Potassium: 4.3 mmol/L (ref 3.5–5.1)
Sodium: 138 mmol/L (ref 135–145)

## 2016-08-16 LAB — CBC WITH DIFFERENTIAL/PLATELET
BASOS ABS: 0 10*3/uL (ref 0.0–0.1)
Basophils Relative: 0 %
Eosinophils Absolute: 0.4 10*3/uL (ref 0.0–0.7)
Eosinophils Relative: 4 %
HEMATOCRIT: 39.6 % (ref 39.0–52.0)
Hemoglobin: 13.2 g/dL (ref 13.0–17.0)
LYMPHS PCT: 27 %
Lymphs Abs: 2.9 10*3/uL (ref 0.7–4.0)
MCH: 28.4 pg (ref 26.0–34.0)
MCHC: 33.3 g/dL (ref 30.0–36.0)
MCV: 85.2 fL (ref 78.0–100.0)
MONO ABS: 1.1 10*3/uL — AB (ref 0.1–1.0)
Monocytes Relative: 10 %
NEUTROS ABS: 6.2 10*3/uL (ref 1.7–7.7)
Neutrophils Relative %: 59 %
Platelets: 272 10*3/uL (ref 150–400)
RBC: 4.65 MIL/uL (ref 4.22–5.81)
RDW: 14.6 % (ref 11.5–15.5)
WBC: 10.6 10*3/uL — ABNORMAL HIGH (ref 4.0–10.5)

## 2016-08-16 LAB — TROPONIN I: Troponin I: <0.03 ng/mL (ref <0.03)

## 2016-08-16 LAB — MAGNESIUM: Magnesium: 2 mg/dL (ref 1.7–2.4)

## 2016-08-16 MED ORDER — METOPROLOL TARTRATE 50 MG PO TABS: 50.0000 mg | ORAL_TABLET | Freq: Two times a day (BID) | ORAL | 0 refills | Status: DC

## 2016-08-16 MED ORDER — ASPIRIN 81 MG PO CHEW: 81.0000 mg | CHEWABLE_TABLET | Freq: Every day | ORAL | 0 refills | Status: DC

## 2016-08-16 NOTE — ED Triage Notes (Signed)
Pt states while at work yesterday at [employer] he felt dizzy, SOB and had heart palpitations. Left work went to [facility], was told in atrial fib and wanted to admit pt, pt unable to stay but want to be re-checked, has had no other episodes since

## 2016-08-16 NOTE — ED Provider Notes (Signed)
[facility] DEPT Provider Note   CSN: [id number] Arrival date & time: 08/16/16  0546     History   Chief Complaint Chief Complaint  Patient presents with  . Palpitations    irregular heartbeat    HPI Joe [name] is a 46 y.o. male.  Patient with history of paroxysmal atrial fibrillation on anticoagulation presenting for "second opinion". He states approximately 27 hours ago while at work he developed dizziness, shortness of breath palpitations. He went to [facility] and was found to be in atrial fibrillation. He was apparently admitted for rate control. He decided to leave around 4 PM 12 hours after admission. He states one hour after leaving, he went back into normal rhythm. He denies any further episodes of shortness of breath, dizziness or palpitations. No chest pain. He came here today to make sure that he is back and regular rhythm. He states he feels "awesome". Patient was seen in November 2017 for atrial fibrillation. He did not follow up with cardiology. He did not fill the metoprolol prescription. He has not been taking baby aspirin as recommended by Dr. Harl Bowie. Patient was seen by GI recently for rectal bleeding and is scheduled for colonoscopy next month. No further episodes of rectal bleeding.   The history is provided by the patient.  Palpitations   Associated symptoms include shortness of breath. Pertinent negatives include no fever, no chest pain, no abdominal pain, no nausea, no vomiting, no headaches, no dizziness, no weakness and no cough.    Past Medical History:  Diagnosis Date  . Hematuria   . Kidney stones   . Pneumonia     Patient Active Problem List   Diagnosis Date Noted  . Rectal bleeding 08/12/2016    Past Surgical History:  Procedure Laterality Date  . CHEST TUBE INSERTION         Home Medications    Prior to Admission medications   Medication Sig Start Date End Date Taking? Authorizing Provider  peg 3350 powder (MOVIPREP)  100 g SOLR Take 1 kit (200 g total) by mouth as directed. 08/12/16   Rourk, Cristopher Estimable, MD    Family History Family History  Problem Relation Age of Onset  . Colon cancer Neg Hx   . Liver disease Neg Hx     Social History Social History  Substance Use Topics  . Smoking status: Heavy Tobacco Smoker    Packs/day: 0.50    Types: Cigarettes  . Smokeless tobacco: Never Used  . Alcohol use Yes     Comment: socially, on weekends a pint     Allergies   Patient has no known allergies.   Review of Systems Review of Systems  Constitutional: Negative for activity change, appetite change and fever.  HENT: Negative for congestion and rhinorrhea.   Respiratory: Positive for shortness of breath. Negative for cough and chest tightness.   Cardiovascular: Positive for palpitations. Negative for chest pain.  Gastrointestinal: Negative for abdominal pain, nausea and vomiting.  Genitourinary: Negative for dysuria and hematuria.  Musculoskeletal: Negative for arthralgias and myalgias.  Neurological: Positive for light-headedness. Negative for dizziness, weakness and headaches.    all other systems are negative except as noted in the HPI and PMH.   Physical Exam Updated Vital Signs BP 126/77 (BP Location: Left Arm)   Pulse 70   Temp 98.4 F (36.9 C) (Oral)   Resp 18   Ht '6\' 2"'  (1.88 m)   Wt 98.4 kg (217 lb)   SpO2 99%  BMI 27.86 kg/m   Physical Exam  Constitutional: He is oriented to person, place, and time. He appears well-developed and well-nourished. No distress.  HENT:  Head: Normocephalic and atraumatic.  Mouth/Throat: Oropharynx is clear and moist. No oropharyngeal exudate.  Eyes: Conjunctivae and EOM are normal. Pupils are equal, round, and reactive to light.  Neck: Normal range of motion. Neck supple.  No meningismus.  Cardiovascular: Normal rate, regular rhythm, normal heart sounds and intact distal pulses.   No murmur heard. Pulmonary/Chest: Effort normal and breath  sounds normal. No respiratory distress. He exhibits no tenderness.  Abdominal: Soft. There is no tenderness. There is no rebound and no guarding.  Musculoskeletal: Normal range of motion. He exhibits no edema or tenderness.  Neurological: He is alert and oriented to person, place, and time. No cranial nerve deficit. He exhibits normal muscle tone. Coordination normal.   5/5 strength throughout. CN 2-12 intact.Equal grip strength.   Skin: Skin is warm.  Psychiatric: He has a normal mood and affect. His behavior is normal.  Nursing note and vitals reviewed.    ED Treatments / Results  Labs (all labs ordered are listed, but only abnormal results are displayed) Labs Reviewed  CBC WITH DIFFERENTIAL/PLATELET - Abnormal; Notable for the following:       Result Value   WBC 10.6 (*)    Monocytes Absolute 1.1 (*)    All other components within normal limits  BASIC METABOLIC PANEL - Abnormal; Notable for the following:    Calcium 8.7 (*)    All other components within normal limits  MAGNESIUM  TROPONIN I    EKG  EKG Interpretation  Date/Time:  Tuesday Aug 16 2016 05:59:58 EDT Ventricular Rate:  77 PR Interval:    QRS Duration: 78 QT Interval:  365 QTC Calculation: 413 R Axis:   86 Text Interpretation:  Sinus rhythm Baseline wander in lead(s) V2 V6 now sinus Confirmed by Ezequiel Essex [phone]) on 08/16/2016 6:08:48 AM       Radiology No results found.  Procedures Procedures (including critical care time)  Medications Ordered in ED Medications - No data to display   Initial Impression / Assessment and Plan / ED Course  I have reviewed the triage vital signs and the nursing notes.  Pertinent labs & imaging results that were available during my care of the patient were reviewed by me and considered in my medical decision making (see chart for details).    Patient presents for recheck to make sure he is in sinus rhythm. Denies any complaints. History of paroxysmal atrial  fibrillation, noncompliant with medications prescribed last November.  Patient is in sinus rhythm arrival. EKG shows no ST changes.  Per Dr. Nelly Laurence consult note on 01/21/16, recommended ASA 81 mg, metoprolol 50 mg BID. NOAC was not recommended since Chasvasc was 0.  CHA2DS2/VAS Stroke Risk Points      0 >= 2 Points: High Risk  1 - 1.99 Points: Medium Risk  0 Points: Low Risk    The patient's score has not changed in the past year.:  No Change         Details    Note: External data might be a factor in metrics not marked with    Points Metrics   This score determines the patient's risk of having a stroke if the  patient has atrial fibrillation.       0 Has Congestive Heart Failure:  No   0 Has Vascular Disease:  No   0  Has Hypertension:  No   0 Age:  72   0 Has Diabetes:  No   0 Had Stroke:  No Had TIA:  No Had thromboembolism:  No   0 Male:  No       labs reassuring.  Remains in sinus rhythm.  Patient prescribed ASA and metoprolol as previously recommended by cardiology.  Follow up in cardiology office. Return precautions discussed.   Final Clinical Impressions(s) / ED Diagnoses   Final diagnoses:  Palpitations    New Prescriptions New Prescriptions   No medications on file     Ezequiel Essex, MD 08/16/16 [phone]

## 2016-08-16 NOTE — Discharge Instructions (Addendum)
Take the medications as prescribed. Follow up with the cardiologist. Return to the ED if you develop new or worsening symptoms.

## 2016-08-16 NOTE — ED Notes (Signed)
Lab made aware blood needs to be drawn

## 2016-08-16 NOTE — ED Notes (Signed)
Lab at bedside

## 2016-08-16 NOTE — Progress Notes (Signed)
No pcp per patient 

## 2016-09-23 ENCOUNTER — Encounter (HOSPITAL_COMMUNITY)
Admission: RE | Admit: 2016-09-23 | Discharge: 2016-09-23 | Disposition: A | Discharge status: Home / Self Care | Payer: Medicaid Other | Source: Ambulatory Visit | Attending: Internal Medicine | Admitting: Internal Medicine

## 2016-09-23 ENCOUNTER — Encounter (HOSPITAL_COMMUNITY): Payer: Self-pay

## 2016-09-23 HISTORY — DX: Unspecified atrial fibrillation: I48.91

## 2016-09-23 HISTORY — DX: Cardiac arrhythmia, unspecified: I49.9

## 2016-09-28 ENCOUNTER — Ambulatory Visit (HOSPITAL_COMMUNITY): Payer: Medicaid Other | Admitting: Anesthesiology

## 2016-09-28 ENCOUNTER — Encounter (HOSPITAL_COMMUNITY)
Admission: RE | Disposition: A | Discharge status: Home / Self Care | Payer: Self-pay | Source: Ambulatory Visit | Attending: Internal Medicine

## 2016-09-28 ENCOUNTER — Encounter (HOSPITAL_COMMUNITY): Payer: Self-pay | Admitting: *Deleted

## 2016-09-28 ENCOUNTER — Ambulatory Visit (HOSPITAL_COMMUNITY)
Admission: RE | Admit: 2016-09-28 | Discharge: 2016-09-28 | Disposition: A | Discharge status: Home / Self Care | Payer: Medicaid Other | Source: Ambulatory Visit | Attending: Internal Medicine | Admitting: Internal Medicine

## 2016-09-28 DIAGNOSIS — K640 First degree hemorrhoids: Secondary | ICD-10-CM | POA: Insufficient documentation

## 2016-09-28 DIAGNOSIS — K635 Polyp of colon: Secondary | ICD-10-CM

## 2016-09-28 DIAGNOSIS — D123 Benign neoplasm of transverse colon: Secondary | ICD-10-CM | POA: Insufficient documentation

## 2016-09-28 DIAGNOSIS — K625 Hemorrhage of anus and rectum: Secondary | ICD-10-CM

## 2016-09-28 DIAGNOSIS — I4891 Unspecified atrial fibrillation: Secondary | ICD-10-CM | POA: Diagnosis not present

## 2016-09-28 DIAGNOSIS — Z7982 Long term (current) use of aspirin: Secondary | ICD-10-CM | POA: Insufficient documentation

## 2016-09-28 DIAGNOSIS — K921 Melena: Secondary | ICD-10-CM

## 2016-09-28 DIAGNOSIS — D125 Benign neoplasm of sigmoid colon: Secondary | ICD-10-CM | POA: Insufficient documentation

## 2016-09-28 DIAGNOSIS — F17210 Nicotine dependence, cigarettes, uncomplicated: Secondary | ICD-10-CM | POA: Diagnosis not present

## 2016-09-28 HISTORY — PX: POLYPECTOMY: SHX5525

## 2016-09-28 HISTORY — PX: COLONOSCOPY WITH PROPOFOL: SHX5780

## 2016-09-28 SURGERY — COLONOSCOPY WITH PROPOFOL
Anesthesia: Monitor Anesthesia Care

## 2016-09-28 MED ORDER — PROPOFOL 10 MG/ML IV BOLUS
INTRAVENOUS | Status: DC | PRN
  Administered 2016-09-28: 10 mg via INTRAVENOUS
  Administered 2016-09-28 (×2): 20 mg via INTRAVENOUS

## 2016-09-28 MED ORDER — MIDAZOLAM HCL 2 MG/2ML IJ SOLN
1.0000 mg | INTRAMUSCULAR | Status: AC
  Administered 2016-09-28: 2 mg via INTRAVENOUS

## 2016-09-28 MED ORDER — PROPOFOL 10 MG/ML IV BOLUS
INTRAVENOUS | Status: AC
  Filled 2016-09-28: qty 40

## 2016-09-28 MED ORDER — PROPOFOL 500 MG/50ML IV EMUL
INTRAVENOUS | Status: DC | PRN
  Administered 2016-09-28: 150 ug/kg/min via INTRAVENOUS
  Administered 2016-09-28: 13:00:00 via INTRAVENOUS

## 2016-09-28 MED ORDER — CHLORHEXIDINE GLUCONATE CLOTH 2 % EX PADS: 6.0000 | MEDICATED_PAD | Freq: Once | CUTANEOUS | Status: DC

## 2016-09-28 MED ORDER — FENTANYL CITRATE (PF) 100 MCG/2ML IJ SOLN
25.0000 ug | Freq: Once | INTRAMUSCULAR | Status: AC
  Administered 2016-09-28: 25 ug via INTRAVENOUS

## 2016-09-28 MED ORDER — FENTANYL CITRATE (PF) 100 MCG/2ML IJ SOLN
INTRAMUSCULAR | Status: AC
  Filled 2016-09-28: qty 2

## 2016-09-28 MED ORDER — LACTATED RINGERS IV SOLN
INTRAVENOUS | Status: DC
  Administered 2016-09-28: 1000 mL via INTRAVENOUS

## 2016-09-28 MED ORDER — MIDAZOLAM HCL 2 MG/2ML IJ SOLN
INTRAMUSCULAR | Status: AC
  Filled 2016-09-28: qty 2

## 2016-09-28 NOTE — Transfer of Care (Signed)
Immediate Anesthesia Transfer of Care Note  Patient: Joe [name]  Procedure(s) Performed: Procedure(s) with comments: COLONOSCOPY WITH PROPOFOL (N/A) - 1215 POLYPECTOMY - sigmoid colon polyps times 3  cs and splenic flexure polyp  Patient Location: PACU  Anesthesia Type:MAC  Level of Consciousness: awake  Airway & Oxygen Therapy: Patient Spontanous Breathing  Post-op Assessment: Report given to RN  Post vital signs: Reviewed  Last Vitals:  Vitals:   09/28/16 1250 09/28/16 1252  BP: 128/89 128/89  Pulse:    Resp: 16 (!) 55  Temp:      Last Pain:  Vitals:   09/28/16 1108  TempSrc: Oral      Patients Stated Pain Goal: 8 (82/50/53 9767)  Complications: No apparent anesthesia complications

## 2016-09-28 NOTE — Anesthesia Postprocedure Evaluation (Signed)
Anesthesia Post Note  Patient: Joe [name]  Procedure(s) Performed: Procedure(s) (LRB): COLONOSCOPY WITH PROPOFOL (N/A) POLYPECTOMY  Patient location during evaluation: Short Stay Anesthesia Type: MAC Level of consciousness: awake and alert Pain management: satisfactory to patient Vital Signs Assessment: post-procedure vital signs reviewed and stable Respiratory status: spontaneous breathing Cardiovascular status: stable Postop Assessment: no signs of nausea or vomiting Anesthetic complications: no     Last Vitals:  Vitals:   09/28/16 1330 09/28/16 1340  BP: 101/66 103/76  Pulse: 73 71  Resp: 20 19  Temp:      Last Pain:  Vitals:   09/28/16 1108  TempSrc: Oral                 Eoghan Belcher

## 2016-09-28 NOTE — Op Note (Signed)
[facility] Patient Name: Joe [name] Procedure Date: 09/28/2016 12:34 PM MRN: [id number] Date of Birth: 1971-01-12 Attending MD: Norvel Richards , MD CSN: [id number] Age: 46 Admit Type: Outpatient Procedure:                Colonoscopy Indications:              Hematochezia Providers:                Norvel Richards, MD, Janeece Riggers, RN, Aram Candela Referring MD:              Medicines:                Propofol per Anesthesia Complications:            No immediate complications. Estimated Blood Loss:     Estimated blood loss was minimal. Procedure:                Pre-Anesthesia Assessment:                           - Prior to the procedure, a History and Physical                            was performed, and patient medications and                            allergies were reviewed. The patient's tolerance of                            previous anesthesia was also reviewed. The risks                            and benefits of the procedure and the sedation                            options and risks were discussed with the patient.                            All questions were answered, and informed consent                            was obtained. Prior Anticoagulants: The patient has                            taken no previous anticoagulant or antiplatelet                            agents. ASA Grade Assessment: II - A patient with                            mild systemic disease. After reviewing the risks                            and  benefits, the patient was deemed in                            satisfactory condition to undergo the procedure.                           After obtaining informed consent, the colonoscope                            was passed under direct vision. Throughout the                            procedure, the patient's blood pressure, pulse, and                            oxygen saturations were monitored  continuously. The                            EC-[id number] ([id number]) scope was introduced through                            the and advanced to the the cecum, identified by                            appendiceal orifice and ileocecal valve. The                            ileocecal valve, appendiceal orifice, and rectum                            were photographed. The entire colon was well                            visualized. The colonoscopy was performed without                            difficulty. The patient tolerated the procedure                            well. The quality of the bowel preparation was                            adequate. Scope In: 1:02:22 PM Scope Out: 1:18:14 PM Scope Withdrawal Time: 0 hours 12 minutes 32 seconds  Total Procedure Duration: 0 hours 15 minutes 52 seconds  Findings:      The perianal and digital rectal examinations were normal.      Non-bleeding internal hemorrhoids were found during retroflexion. The       hemorrhoids were moderate, medium-sized and Grade I (internal       hemorrhoids that do not prolapse).      Four semi-pedunculated polyps were found in the sigmoid colon and       splenic flexure. The polyps were 4 to 7 mm in size. These polyps were       removed with a cold snare. Resection and retrieval  were complete.       Estimated blood loss was minimal.      The exam was otherwise without abnormality on direct and retroflexion       views. Impression:               - Non-bleeding internal hemorrhoids.                           - Four 4 to 7 mm polyps in the sigmoid colon and at                            the splenic flexure, removed with a cold snare.                            Resected and retrieved.                           - The examination was otherwise normal on direct                            and retroflexion views. Moderate Sedation:      Moderate (conscious) sedation was personally administered by an       anesthesia  professional. The following parameters were monitored: oxygen       saturation, heart rate, blood pressure, respiratory rate, EKG, adequacy       of pulmonary ventilation, and response to care. Total physician       intraservice time was 20 minutes. Recommendation:           - Patient has a contact number available for                            emergencies. The signs and symptoms of potential                            delayed complications were discussed with the                            patient. Return to normal activities tomorrow.                            Written discharge instructions were provided to the                            patient.                           - Resume previous diet.                           - Continue present medications.                           - Repeat colonoscopy date to be determined after                            pending pathology results are reviewed  for                            surveillance.                           - Return to GI office in 8 weeks. Procedure Code(s):        --- Professional ---                           [phone], Colonoscopy, flexible; with removal of                            tumor(s), polyp(s), or other lesion(s) by snare                            technique Diagnosis Code(s):        --- Professional ---                           D12.5, Benign neoplasm of sigmoid colon                           D12.3, Benign neoplasm of transverse colon (hepatic                            flexure or splenic flexure)                           K64.0, First degree hemorrhoids                           K92.1, Melena (includes Hematochezia) CPT copyright 2016 [employer]. All rights reserved. The codes documented in this report are preliminary and upon coder review may  be revised to meet current compliance requirements. Cristopher Estimable. Justine Dines, MD Norvel Richards, MD 09/28/2016 1:26:46 PM This report has been signed  electronically. Number of Addenda: 0

## 2016-09-28 NOTE — Discharge Instructions (Addendum)
Colonoscopy Discharge Instructions  Read the instructions outlined below and refer to this sheet in the next few weeks. These discharge instructions provide you with general information on caring for yourself after you leave the hospital. Your doctor may also give you specific instructions. While your treatment has been planned according to the most current medical practices available, unavoidable complications occasionally occur. If you have any problems or questions after discharge, call Dr. Gala Romney at [phone]. ACTIVITY  You may resume your regular activity, but move at a slower pace for the next 24 hours.   Take frequent rest periods for the next 24 hours.   Walking will help get rid of the air and reduce the bloated feeling in your belly (abdomen).   No driving for 24 hours (because of the medicine (anesthesia) used during the test).    Do not sign any important legal documents or operate any machinery for 24 hours (because of the anesthesia used during the test).  NUTRITION  Drink plenty of fluids.   You may resume your normal diet as instructed by your doctor.   Begin with a light meal and progress to your normal diet. Heavy or fried foods are harder to digest and may make you feel sick to your stomach (nauseated).   Avoid alcoholic beverages for 24 hours or as instructed.  MEDICATIONS  You may resume your normal medications unless your doctor tells you otherwise.  WHAT YOU CAN EXPECT TODAY  Some feelings of bloating in the abdomen.   Passage of more gas than usual.   Spotting of blood in your stool or on the toilet paper.  IF YOU HAD POLYPS REMOVED DURING THE COLONOSCOPY:  No aspirin products for 7 days or as instructed.   No alcohol for 7 days or as instructed.   Eat a soft diet for the next 24 hours.  FINDING OUT THE RESULTS OF YOUR TEST Not all test results are available during your visit. If your test results are not back during the visit, make an appointment  with your caregiver to find out the results. Do not assume everything is normal if you have not heard from your caregiver or the medical facility. It is important for you to follow up on all of your test results.  SEEK IMMEDIATE MEDICAL ATTENTION IF:  You have more than a spotting of blood in your stool.   Your belly is swollen (abdominal distention).   You are nauseated or vomiting.   You have a temperature over 101.   You have abdominal pain or discomfort that is severe or gets worse throughout the day.    Colon polyp and hemorrhoid information provided  Pamphlet on hemorrhoid banding provided  Office visit with [place] in 2 months  Further recommendations to follow pending review of pathology report    Colon Polyps Polyps are tissue growths inside the body. Polyps can grow in many places, including the large intestine (colon). A polyp may be a round bump or a mushroom-shaped growth. You could have one polyp or several. Most colon polyps are noncancerous (benign). However, some colon polyps can become cancerous over time. What are the causes? The exact cause of colon polyps is not known. What increases the risk? This condition is more likely to develop in people who:  Have a family history of colon cancer or colon polyps.  Are older than 53 or older than 45 if they are African American.  Have inflammatory bowel disease, such as ulcerative colitis or Crohn disease.  Are overweight.  Smoke cigarettes.  Do not get enough exercise.  Drink too much alcohol.  Eat a diet that is: ? High in fat and red meat. ? Low in fiber.  Had childhood cancer that was treated with abdominal radiation.  What are the signs or symptoms? Most polyps do not cause symptoms. If you have symptoms, they may include:  Blood coming from your rectum when having a bowel movement.  Blood in your stool.The stool may look dark red or black.  A change in bowel habits, such as constipation or  diarrhea.  How is this diagnosed? This condition is diagnosed with a colonoscopy. This is a procedure that uses a lighted, flexible scope to look at the inside of your colon. How is this treated? Treatment for this condition involves removing any polyps that are found. Those polyps will then be tested for cancer. If cancer is found, your health care provider will talk to you about options for colon cancer treatment. Follow these instructions at home: Diet  Eat plenty of fiber, such as fruits, vegetables, and whole grains.  Eat foods that are high in calcium and vitamin D, such as milk, cheese, yogurt, eggs, liver, fish, and broccoli.  Limit foods high in fat, red meats, and processed meats, such as hot dogs, sausage, bacon, and lunch meats.  Maintain a healthy weight, or lose weight if recommended by your health care provider. General instructions  Do not smoke cigarettes.  Do not drink alcohol excessively.  Keep all follow-up visits as told by your health care provider. This is important. This includes keeping regularly scheduled colonoscopies. Talk to your health care provider about when you need a colonoscopy.  Exercise every day or as told by your health care provider. Contact a health care provider if:  You have new or worsening bleeding during a bowel movement.  You have new or increased blood in your stool.  You have a change in bowel habits.  You unexpectedly lose weight. This information is not intended to replace advice given to you by your health care provider. Make sure you discuss any questions you have with your health care provider. Document Released: 12/02/2003 Document Revised: 08/13/2015 Document Reviewed: 01/26/2015 Elsevier Interactive Patient Education  2018 [employer].    Hemorrhoids Hemorrhoids are swollen veins in and around the rectum or anus. There are two types of hemorrhoids: Internal hemorrhoids. These occur in the veins that are just inside  the rectum. They may poke through to the outside and become irritated and painful. External hemorrhoids. These occur in the veins that are outside of the anus and can be felt as a painful swelling or hard lump near the anus.  Most hemorrhoids do not cause serious problems, and they can be managed with home treatments such as diet and lifestyle changes. If home treatments do not help your symptoms, procedures can be done to shrink or remove the hemorrhoids. What are the causes? This condition is caused by increased pressure in the anal area. This pressure may result from various things, including: Constipation. Straining to have a bowel movement. Diarrhea. Pregnancy. Obesity. Sitting for long periods of time. Heavy lifting or other activity that causes you to strain. Anal sex.  What are the signs or symptoms? Symptoms of this condition include: Pain. Anal itching or irritation. Rectal bleeding. Leakage of stool (feces). Anal swelling. One or more lumps around the anus.  How is this diagnosed? This condition can often be diagnosed through a visual exam. Other  exams or tests may also be done, such as: Examination of the rectal area with a gloved hand (digital rectal exam). Examination of the anal canal using a small tube (anoscope). A blood test, if you have lost a significant amount of blood. A test to look inside the colon (sigmoidoscopy or colonoscopy).  How is this treated? This condition can usually be treated at home. However, various procedures may be done if dietary changes, lifestyle changes, and other home treatments do not help your symptoms. These procedures can help make the hemorrhoids smaller or remove them completely. Some of these procedures involve surgery, and others do not. Common procedures include: Rubber band ligation. Rubber bands are placed at the base of the hemorrhoids to cut off the blood supply to them. Sclerotherapy. Medicine is injected into the  hemorrhoids to shrink them. Infrared coagulation. A type of light energy is used to get rid of the hemorrhoids. Hemorrhoidectomy surgery. The hemorrhoids are surgically removed, and the veins that supply them are tied off. Stapled hemorrhoidopexy surgery. A circular stapling device is used to remove the hemorrhoids and use staples to cut off the blood supply to them.  Follow these instructions at home: Eating and drinking Eat foods that have a lot of fiber in them, such as whole grains, beans, nuts, fruits, and vegetables. Ask your health care provider about taking products that have added fiber (fiber supplements). Drink enough fluid to keep your urine clear or pale yellow. Managing pain and swelling Take warm sitz baths for 20 minutes, 3-4 times a day to ease pain and discomfort. If directed, apply ice to the affected area. Using ice packs between sitz baths may be helpful. Put ice in a plastic bag. Place a towel between your skin and the bag. Leave the ice on for 20 minutes, 2-3 times a day. General instructions Take over-the-counter and prescription medicines only as told by your health care provider. Use medicated creams or suppositories as told. Exercise regularly. Go to the bathroom when you have the urge to have a bowel movement. Do not wait. Avoid straining to have bowel movements. Keep the anal area dry and clean. Use wet toilet paper or moist towelettes after a bowel movement. Do not sit on the toilet for long periods of time. This increases blood pooling and pain. Contact a health care provider if: You have increasing pain and swelling that are not controlled by treatment or medicine. You have uncontrolled bleeding. You have difficulty having a bowel movement, or you are unable to have a bowel movement. You have pain or inflammation outside the area of the hemorrhoids. This information is not intended to replace advice given to you by your health care provider. Make sure you  discuss any questions you have with your health care provider. Document Released: 03/04/2000 Document Revised: 08/05/2015 Document Reviewed: 11/19/2014 Elsevier Interactive Patient Education  2017 [employer].

## 2016-09-28 NOTE — H&P (Signed)
@  [email or web address]   Primary Care Physician:  Patient, No Pcp Per Primary Gastroenterologist:  Dr. Gala Romney  Pre-Procedure History & Physical: HPI:  Joe [name] is a 46 y.o. male here for for further evaluation of rectal bleeding. He is here for diagnostic colonoscopy. No prior colonoscopy.  Past Medical History:  Diagnosis Date  . Atrial fibrillation ([place])   . Dysrhythmia   . Hematuria   . Kidney stones   . Pneumonia     Past Surgical History:  Procedure Laterality Date  . CHEST TUBE INSERTION      Prior to Admission medications   Medication Sig Start Date End Date Taking? Authorizing Provider  aspirin 81 MG chewable tablet Chew 1 tablet (81 mg total) by mouth daily. 08/16/16  Yes Rancour, Annie Main, MD  ketorolac (TORADOL) 10 MG tablet Take 10 mg by mouth every 6 (six) hours as needed for moderate pain.   Yes [provider]  metoprolol tartrate (LOPRESSOR) 50 MG tablet Take 1 tablet (50 mg total) by mouth 2 (two) times daily. 08/16/16  Yes Rancour, Annie Main, MD  peg 3350 powder (MOVIPREP) 100 g SOLR Take 1 kit (200 g total) by mouth as directed. 08/12/16  Yes Daneil Dolin, MD    Allergies as of 08/12/2016  . (No Known Allergies)    Family History  Problem Relation Age of Onset  . Colon cancer Neg Hx   . Liver disease Neg Hx     Social History   Social History  . Marital status: Single    Spouse name: N/A  . Number of children: 2  . Years of education: N/A   Occupational History  . unemployed    Social History Main Topics  . Smoking status: Heavy Tobacco Smoker    Packs/day: 0.50    Types: Cigarettes  . Smokeless tobacco: Never Used  . Alcohol use Yes     Comment: socially, on weekends a pint  . Drug use: No  . Sexual activity: Yes   Other Topics Concern  . Not on file   Social History Narrative  . No narrative on file    Review of Systems: See HPI, otherwise negative ROS  Physical Exam: BP 130/86   Pulse 70   Temp 97.9 F (36.6 C) (Oral)    Resp 18   Ht '6\' 2"'$  (1.88 m)   Wt 217 lb (98.4 kg)   SpO2 97%   BMI 27.86 kg/m  General:   Alert,  Well-developed, well-nourished, pleasant and cooperative in NAD Neck:  Supple; no masses or thyromegaly. No significant cervical adenopathy. Lungs:  Clear throughout to auscultation.   No wheezes, crackles, or rhonchi. No acute distress. Heart:  Regular rate and rhythm; no murmurs, clicks, rubs,  or gallops. Abdomen: Non-distended, normal bowel sounds.  Soft and nontender without appreciable mass or hepatosplenomegaly.  Pulses:  Normal pulses noted. Extremities:  Without clubbing or edema.  Impression:  Intermittent rectal bleeding deserves further evaluation. We'll offer the patient a diagnostic colonoscopy per plan. The risks, benefits, limitations, alternatives and imponderables have been reviewed with the patient. Questions have been answered. All parties are agreeable.      Notice: This dictation was prepared with Dragon dictation along with smaller phrase technology. Any transcriptional errors that result from this process are unintentional and may not be corrected upon review.

## 2016-09-28 NOTE — Anesthesia Preprocedure Evaluation (Signed)
Anesthesia Evaluation  Patient identified by MRN, date of birth, ID band Patient awake    Reviewed: Allergy & Precautions, NPO status , Patient's Chart, lab work & pertinent test results  Airway Mallampati: I  TM Distance: >3 FB Neck ROM: Full    Dental  (+) Teeth Intact   Pulmonary Current Smoker,    breath sounds clear to auscultation       Cardiovascular + dysrhythmias Atrial Fibrillation  Rhythm:Regular Rate:Normal     Neuro/Psych negative neurological ROS  negative psych ROS   GI/Hepatic negative GI ROS,   Endo/Other    Renal/GU Renal disease     Musculoskeletal   Abdominal   Peds  Hematology   Anesthesia Other Findings   Reproductive/Obstetrics                             Anesthesia Physical Anesthesia Plan  ASA: III  Anesthesia Plan: MAC   Post-op Pain Management:    Induction: Intravenous  PONV Risk Score and Plan:   Airway Management Planned: Simple Face Mask  Additional Equipment:   Intra-op Plan:   Post-operative Plan:   Informed Consent: I have reviewed the patients History and Physical, chart, labs and discussed the procedure including the risks, benefits and alternatives for the proposed anesthesia with the patient or authorized representative who has indicated his/her understanding and acceptance.     Plan Discussed with:   Anesthesia Plan Comments:         Anesthesia Quick Evaluation

## 2016-09-30 ENCOUNTER — Emergency Department (HOSPITAL_COMMUNITY)
Admission: EM | Admit: 2016-09-30 | Discharge: 2016-09-30 | Disposition: A | Discharge status: Home / Self Care | Payer: Medicaid Other | Attending: Emergency Medicine | Admitting: Emergency Medicine

## 2016-09-30 ENCOUNTER — Encounter: Payer: Self-pay | Admitting: Internal Medicine

## 2016-09-30 ENCOUNTER — Encounter (HOSPITAL_COMMUNITY): Payer: Self-pay | Admitting: Emergency Medicine

## 2016-09-30 DIAGNOSIS — M545 Low back pain, unspecified: Secondary | ICD-10-CM

## 2016-09-30 DIAGNOSIS — F17210 Nicotine dependence, cigarettes, uncomplicated: Secondary | ICD-10-CM | POA: Insufficient documentation

## 2016-09-30 DIAGNOSIS — Z7982 Long term (current) use of aspirin: Secondary | ICD-10-CM | POA: Diagnosis not present

## 2016-09-30 DIAGNOSIS — Z79899 Other long term (current) drug therapy: Secondary | ICD-10-CM | POA: Diagnosis not present

## 2016-09-30 MED ORDER — METHOCARBAMOL 500 MG PO TABS: 500.0000 mg | ORAL_TABLET | Freq: Two times a day (BID) | ORAL | 0 refills | Status: DC | PRN

## 2016-09-30 MED ORDER — IBUPROFEN 800 MG PO TABS: 800.0000 mg | ORAL_TABLET | Freq: Three times a day (TID) | ORAL | 0 refills | Status: DC

## 2016-09-30 MED ORDER — KETOROLAC TROMETHAMINE 60 MG/2ML IM SOLN
60.0000 mg | Freq: Once | INTRAMUSCULAR | Status: AC
  Administered 2016-09-30: 60 mg via INTRAMUSCULAR
  Filled 2016-09-30: qty 2

## 2016-09-30 NOTE — ED Provider Notes (Signed)
[facility] DEPT Provider Note   CSN: [id number] Arrival date & time: 09/30/16  0741     History   Chief Complaint Chief Complaint  Patient presents with  . Back Pain    HPI Joe [name] is a 46 y.o. male.  HPI  46 y/o male - has left lower back pain - started 3 weeks ago - cramping in nature - lifts heavy items at work, - worse with cough and sneezing - stiff getting out of bed - better with hot bath.  Saw a MD for this a couple of weeks ago and had nsaids and steroid hot (IM).  Had xray at that time as well without acute findings.  He has no hx of IVDU, CA, no neuro c/o.  Past Medical History:  Diagnosis Date  . Atrial fibrillation ([place])   . Dysrhythmia   . Hematuria   . Kidney stones   . Pneumonia     Patient Active Problem List   Diagnosis Date Noted  . Rectal bleeding 08/12/2016    Past Surgical History:  Procedure Laterality Date  . CHEST TUBE INSERTION         Home Medications    Prior to Admission medications   Medication Sig Start Date End Date Taking? Authorizing Provider  aspirin 81 MG chewable tablet Chew 1 tablet (81 mg total) by mouth daily. 08/16/16   Rancour, Annie Main, MD  ibuprofen (ADVIL,MOTRIN) 800 MG tablet Take 1 tablet (800 mg total) by mouth 3 (three) times daily. 09/30/16   Noemi Chapel, MD  ketorolac (TORADOL) 10 MG tablet Take 10 mg by mouth every 6 (six) hours as needed for moderate pain.    [provider]  methocarbamol (ROBAXIN) 500 MG tablet Take 1 tablet (500 mg total) by mouth 2 (two) times daily as needed for muscle spasms. 09/30/16   Noemi Chapel, MD  metoprolol tartrate (LOPRESSOR) 50 MG tablet Take 1 tablet (50 mg total) by mouth 2 (two) times daily. 08/16/16   Rancour, Annie Main, MD  peg 3350 powder (MOVIPREP) 100 g SOLR Take 1 kit (200 g total) by mouth as directed. 08/12/16   Rourk, Cristopher Estimable, MD    Family History Family History  Problem Relation Age of Onset  . Colon cancer Neg Hx   . Liver disease Neg Hx       Social History Social History  Substance Use Topics  . Smoking status: Heavy Tobacco Smoker    Packs/day: 0.50    Types: Cigarettes  . Smokeless tobacco: Never Used  . Alcohol use Yes     Comment: socially, on weekends a pint     Allergies   Patient has no known allergies.   Review of Systems Review of Systems  Constitutional: Negative for chills and fever.  Cardiovascular: Negative for leg swelling.  Gastrointestinal: Negative for nausea and vomiting.       No incontinence of bowel  Genitourinary: Negative for difficulty urinating.       No incontinence or retention  Musculoskeletal: Positive for back pain. Negative for neck pain.  Skin: Negative for rash.  Neurological: Negative for weakness and numbness.     Physical Exam Updated Vital Signs BP 123/87 (BP Location: Right Arm)   Pulse 76   Temp 98.2 F (36.8 C) (Oral)   Resp 18   Ht _0  (1.88 m)   Wt 98.4 kg (217 lb)   SpO2 99%   BMI 27.86 kg/m   Physical Exam  Constitutional: He appears well-developed and  well-nourished. No distress.  HENT:  Head: Normocephalic and atraumatic.  Eyes: Conjunctivae are normal. Right eye exhibits no discharge. Left eye exhibits no discharge. No scleral icterus.  Cardiovascular: Normal rate and regular rhythm.   Pulmonary/Chest: Effort normal and breath sounds normal.  Musculoskeletal: He exhibits tenderness. He exhibits no edema or deformity.  Tenderness of the back over the left lower back No tenderness over the Cervical, Thoracic or Lumbar Spine.  Good rom of the hips  Neurological:  Gait - leaning to the R. Can SLR bilaterally Sensation intact, normal patellar reflexes bilaterally  Skin: Skin is warm and dry. No rash noted. He is not diaphoretic.     ED Treatments / Results  Labs (all labs ordered are listed, but only abnormal results are displayed) Labs Reviewed - No data to display   Radiology No results found.  Procedures Procedures (including  critical care time)  Medications Ordered in ED Medications  ketorolac (TORADOL) injection 60 mg (not administered)     Initial Impression / Assessment and Plan / ED Course  I have reviewed the triage vital signs and the nursing notes.  Pertinent labs & imaging results that were available during my care of the patient were reviewed by me and considered in my medical decision making (see chart for details).     Benign back pain nsaids Pt in agreement Reviewed xrays from 7/5 - no fractures.  Final Clinical Impressions(s) / ED Diagnoses   Final diagnoses:  Acute left-sided low back pain without sciatica    New Prescriptions New Prescriptions   IBUPROFEN (ADVIL,MOTRIN) 800 MG TABLET    Take 1 tablet (800 mg total) by mouth 3 (three) times daily.   METHOCARBAMOL (ROBAXIN) 500 MG TABLET    Take 1 tablet (500 mg total) by mouth 2 (two) times daily as needed for muscle spasms.     Noemi Chapel, MD 09/30/16 [phone]

## 2016-09-30 NOTE — ED Notes (Signed)
Seen at [facility] about 2 weeks ago for back pain and treated, with mild relief.  Denies any injury. Pain is constant, rating pain 10/10.Marland Kitchen

## 2016-09-30 NOTE — Discharge Instructions (Signed)

## 2016-09-30 NOTE — ED Triage Notes (Signed)
Pt reports seen for same x2 weeks ago at [facility]. Pt reports was given medication and an injection and reports minimal relief. Pt denies any known injury. Pt denies gui/gu symptoms. Pt reports back pain radiating to left hip. Pt able to ambulate but reports increase in pain.

## 2016-10-03 ENCOUNTER — Encounter: Payer: Self-pay | Admitting: Internal Medicine

## 2016-11-20 ENCOUNTER — Encounter (HOSPITAL_COMMUNITY): Payer: Self-pay | Admitting: Cardiology

## 2016-11-20 ENCOUNTER — Emergency Department (HOSPITAL_COMMUNITY)
Admission: EM | Admit: 2016-11-20 | Discharge: 2016-11-20 | Disposition: A | Discharge status: Home / Self Care | Payer: Medicaid Other | Attending: Emergency Medicine | Admitting: Emergency Medicine

## 2016-11-20 DIAGNOSIS — Z7982 Long term (current) use of aspirin: Secondary | ICD-10-CM | POA: Diagnosis not present

## 2016-11-20 DIAGNOSIS — Z79899 Other long term (current) drug therapy: Secondary | ICD-10-CM | POA: Insufficient documentation

## 2016-11-20 DIAGNOSIS — Z87891 Personal history of nicotine dependence: Secondary | ICD-10-CM | POA: Insufficient documentation

## 2016-11-20 DIAGNOSIS — S39012A Strain of muscle, fascia and tendon of lower back, initial encounter: Secondary | ICD-10-CM | POA: Diagnosis not present

## 2016-11-20 DIAGNOSIS — Y929 Unspecified place or not applicable: Secondary | ICD-10-CM | POA: Insufficient documentation

## 2016-11-20 DIAGNOSIS — Y999 Unspecified external cause status: Secondary | ICD-10-CM | POA: Insufficient documentation

## 2016-11-20 DIAGNOSIS — X58XXXA Exposure to other specified factors, initial encounter: Secondary | ICD-10-CM | POA: Insufficient documentation

## 2016-11-20 DIAGNOSIS — Y939 Activity, unspecified: Secondary | ICD-10-CM | POA: Insufficient documentation

## 2016-11-20 DIAGNOSIS — S3992XA Unspecified injury of lower back, initial encounter: Secondary | ICD-10-CM | POA: Diagnosis present

## 2016-11-20 MED ORDER — KETOROLAC TROMETHAMINE 60 MG/2ML IM SOLN
60.0000 mg | Freq: Once | INTRAMUSCULAR | Status: AC
  Administered 2016-11-20: 60 mg via INTRAMUSCULAR
  Filled 2016-11-20: qty 2

## 2016-11-20 MED ORDER — METHOCARBAMOL 500 MG PO TABS: 500.0000 mg | ORAL_TABLET | Freq: Four times a day (QID) | ORAL | 0 refills | Status: AC

## 2016-11-20 MED ORDER — IBUPROFEN 600 MG PO TABS: 600.0000 mg | ORAL_TABLET | Freq: Four times a day (QID) | ORAL | 0 refills | Status: DC | PRN

## 2016-11-20 MED ORDER — METHOCARBAMOL 500 MG PO TABS
1000.0000 mg | ORAL_TABLET | Freq: Once | ORAL | Status: AC
  Administered 2016-11-20: 1000 mg via ORAL
  Filled 2016-11-20: qty 2

## 2016-11-20 MED ORDER — OXYCODONE-ACETAMINOPHEN 5-325 MG PO TABS: 1.0000 | ORAL_TABLET | Freq: Four times a day (QID) | ORAL | 0 refills | Status: DC | PRN

## 2016-11-20 NOTE — ED Triage Notes (Signed)
Lower back pain that radiates down left leg times 4-5 days. Denies any injury

## 2016-11-20 NOTE — ED Notes (Signed)
Pt was given new paperwork by Evalee Jefferson, PA including new prescriptions.  Offered to push pt out by wheelchair but family wanted to push pt out.

## 2016-11-20 NOTE — Discharge Instructions (Signed)
Avoid lifting,  Bending,  Twisting or any other activity that worsens your pain over the next week.  Apply a heating pad  your lower back for 20 minutes several times daily.  You should get rechecked if your symptoms are not better over the next 5 days,  Or you develop increased pain,  Weakness in your leg(s) or loss of bladder or bowel function - these can be signs of a worsening condition.  Do not drive within 4 hours of taking the oxycodone - this medicine will make you sleepy.  Also, do not take this medicine and overdo your activity as this medicine will only mask your pain, not heal it.

## 2016-11-20 NOTE — ED Provider Notes (Signed)
[facility] DEPT Provider Note   CSN: [id number] Arrival date & time: 11/20/16  0718     History   Chief Complaint Chief Complaint  Patient presents with  . Back Pain    HPI Joe [name] is a 46 y.o. male presenting with acute low back pain which is similar to prior back pain and started this am when getting out of bed.    Patient denies any new injury specifically.  There is radiation of pain into the left mid buttock.  There has been no weakness or numbness in the lower extremities and no urinary or bowel retention or incontinence.  Patient does not have a history of cancer or IVDU.  The patient has tried rest and hot bath soaks without significant relief of symptoms.  He was seen here last month for similar sx and states the medications given with that visit were very helpful. Marland Kitchen  HPI  Past Medical History:  Diagnosis Date  . Atrial fibrillation ([place])   . Dysrhythmia   . Hematuria   . Kidney stones   . Pneumonia     Patient Active Problem List   Diagnosis Date Noted  . Rectal bleeding 08/12/2016    Past Surgical History:  Procedure Laterality Date  . CHEST TUBE INSERTION    . COLONOSCOPY WITH PROPOFOL N/A 09/28/2016   Procedure: COLONOSCOPY WITH PROPOFOL;  Surgeon: Daneil Dolin, MD;  [facility]: [facility];  Service: Endoscopy;  Laterality: N/A;  1215  . POLYPECTOMY  09/28/2016   Procedure: POLYPECTOMY;  Surgeon: Daneil Dolin, MD;  Location: [facility];  Service: Endoscopy;;  sigmoid colon polyps times 3  cs and splenic flexure polyp       Home Medications    Prior to Admission medications   Medication Sig Start Date End Date Taking? Authorizing Provider  aspirin 81 MG chewable tablet Chew 1 tablet (81 mg total) by mouth daily. 08/16/16   Rancour, Annie Main, MD  ibuprofen (ADVIL,MOTRIN) 600 MG tablet Take 1 tablet (600 mg total) by mouth every 6 (six) hours as needed. 11/20/16   Evalee Jefferson, PA-C  ketorolac (TORADOL) 10 MG tablet Take 10 mg by mouth  every 6 (six) hours as needed for moderate pain.    [provider]  methocarbamol (ROBAXIN) 500 MG tablet Take 1 tablet (500 mg total) by mouth 4 (four) times daily. 11/20/16 11/30/16  Evalee Jefferson, PA-C  metoprolol tartrate (LOPRESSOR) 50 MG tablet Take 1 tablet (50 mg total) by mouth 2 (two) times daily. 08/16/16   Rancour, Annie Main, MD  peg 3350 powder (MOVIPREP) 100 g SOLR Take 1 kit (200 g total) by mouth as directed. 08/12/16   Rourk, Cristopher Estimable, MD    Family History Family History  Problem Relation Age of Onset  . Colon cancer Neg Hx   . Liver disease Neg Hx     Social History Social History  Substance Use Topics  . Smoking status: Heavy Tobacco Smoker    Packs/day: 0.50    Types: Cigarettes  . Smokeless tobacco: Never Used  . Alcohol use Yes     Comment: socially, on weekends a pint     Allergies   Patient has no known allergies.   Review of Systems Review of Systems  Constitutional: Negative for fever.  Respiratory: Negative for shortness of breath.   Cardiovascular: Negative for chest pain and leg swelling.  Gastrointestinal: Negative for abdominal distention, abdominal pain and constipation.  Genitourinary: Negative for difficulty urinating, dysuria, flank pain,  frequency and urgency.  Musculoskeletal: Positive for back pain. Negative for gait problem and joint swelling.  Skin: Negative for rash.  Neurological: Negative for weakness and numbness.     Physical Exam Updated Vital Signs BP 131/70 (BP Location: Left Arm)   Pulse 75   Temp 97.9 F (36.6 C) (Oral)   Resp 16   Ht 6' 1" (1.854 m)   Wt 98.4 kg (217 lb)   SpO2 99%   BMI 28.63 kg/m   Physical Exam  Constitutional: He appears well-developed and well-nourished.  HENT:  Head: Normocephalic.  Eyes: Conjunctivae are normal.  Neck: Normal range of motion. Neck supple.  Cardiovascular: Normal rate and intact distal pulses.   Pedal pulses normal.  Pulmonary/Chest: Effort normal.  Abdominal:  Soft. Bowel sounds are normal. He exhibits no distension and no mass.  Musculoskeletal: Normal range of motion. He exhibits no edema.       Lumbar back: He exhibits tenderness. He exhibits no swelling, no edema and no spasm.  Applying pressure along the left upper buttock improves pain.  No midline ttp.  Neurological: He is alert. He has normal strength. He displays no atrophy and no tremor. No sensory deficit. Gait normal.  Reflex Scores:      Patellar reflexes are 2+ on the right side and 2+ on the left side. No strength deficit noted in hip and knee flexor and extensor muscle groups.  Ankle flexion and extension intact.  SLR intact, increased pain left leg.  Skin: Skin is warm and dry.  Psychiatric: He has a normal mood and affect.  Nursing note and vitals reviewed.    ED Treatments / Results  Labs (all labs ordered are listed, but only abnormal results are displayed) Labs Reviewed - No data to display  EKG  EKG Interpretation None       Radiology No results found.  Procedures Procedures (including critical care time)  Medications Ordered in ED Medications  ketorolac (TORADOL) injection 60 mg (not administered)  methocarbamol (ROBAXIN) tablet 1,000 mg (not administered)     Initial Impression / Assessment and Plan / ED Course  I have reviewed the triage vital signs and the nursing notes.  Pertinent labs & imaging results that were available during my care of the patient were reviewed by me and considered in my medical decision making (see chart for details).     Pt given toradol and robaxin here.  Will tx with robaxin, ibuprofen, discussed continued heat, activities as tolerated , f/u with pcp for a recheck if not improving over the next week.  No neuro deficit on exam or by history to suggest emergent or surgical presentation.  Also discussed worsened sx that should prompt immediate re-evaluation including distal weakness, bowel/bladder  retention/incontinence.        Final Clinical Impressions(s) / ED Diagnoses   Final diagnoses:  Strain of lumbar region, initial encounter    New Prescriptions New Prescriptions   IBUPROFEN (ADVIL,MOTRIN) 600 MG TABLET    Take 1 tablet (600 mg total) by mouth every 6 (six) hours as needed.   METHOCARBAMOL (ROBAXIN) 500 MG TABLET    Take 1 tablet (500 mg total) by mouth 4 (four) times daily.     Evalee Jefferson, PA-C 11/20/16 5409    Dorie Rank, MD 11/23/16 1115

## 2016-11-20 NOTE — ED Notes (Signed)
Pt at discharge wanting to speak to [place], [place]. Pt states "i want to see if I can get some Percocet or something." Almyra Free notified and is at bedside.

## 2016-11-29 ENCOUNTER — Telehealth: Payer: Self-pay | Admitting: Gastroenterology

## 2016-11-29 ENCOUNTER — Encounter: Payer: Self-pay | Admitting: Gastroenterology

## 2016-11-29 ENCOUNTER — Ambulatory Visit: Payer: Medicaid Other | Admitting: Gastroenterology

## 2016-11-29 NOTE — Telephone Encounter (Signed)
PT WAS A NO SHOW AND LETTER SENT  °

## 2017-10-03 ENCOUNTER — Encounter (HOSPITAL_COMMUNITY): Payer: Self-pay | Admitting: *Deleted

## 2017-10-03 ENCOUNTER — Other Ambulatory Visit: Payer: Self-pay

## 2017-10-03 ENCOUNTER — Emergency Department (HOSPITAL_COMMUNITY)
Admission: EM | Admit: 2017-10-03 | Discharge: 2017-10-03 | Disposition: A | Discharge status: Home / Self Care | Payer: Managed Care, Other (non HMO) | Attending: Emergency Medicine | Admitting: Emergency Medicine

## 2017-10-03 DIAGNOSIS — J069 Acute upper respiratory infection, unspecified: Secondary | ICD-10-CM

## 2017-10-03 DIAGNOSIS — F17210 Nicotine dependence, cigarettes, uncomplicated: Secondary | ICD-10-CM | POA: Diagnosis not present

## 2017-10-03 DIAGNOSIS — J209 Acute bronchitis, unspecified: Secondary | ICD-10-CM | POA: Diagnosis not present

## 2017-10-03 DIAGNOSIS — Z79899 Other long term (current) drug therapy: Secondary | ICD-10-CM | POA: Insufficient documentation

## 2017-10-03 DIAGNOSIS — Z7982 Long term (current) use of aspirin: Secondary | ICD-10-CM | POA: Insufficient documentation

## 2017-10-03 HISTORY — DX: Other pulmonary collapse: J98.19

## 2017-10-03 MED ORDER — PREDNISONE 20 MG PO TABS
40.0000 mg | ORAL_TABLET | Freq: Once | ORAL | Status: AC
  Administered 2017-10-03: 40 mg via ORAL
  Filled 2017-10-03: qty 2

## 2017-10-03 MED ORDER — AZITHROMYCIN 250 MG PO TABS: ORAL_TABLET | ORAL | 0 refills | Status: DC

## 2017-10-03 MED ORDER — DEXAMETHASONE 4 MG PO TABS: 4.0000 mg | ORAL_TABLET | Freq: Two times a day (BID) | ORAL | 0 refills | Status: DC

## 2017-10-03 MED ORDER — ACETAMINOPHEN 500 MG PO TABS
1000.0000 mg | ORAL_TABLET | Freq: Once | ORAL | Status: AC
  Administered 2017-10-03: 1000 mg via ORAL
  Filled 2017-10-03: qty 2

## 2017-10-03 MED ORDER — ONDANSETRON HCL 4 MG PO TABS
4.0000 mg | ORAL_TABLET | Freq: Once | ORAL | Status: AC
  Administered 2017-10-03: 4 mg via ORAL
  Filled 2017-10-03: qty 1

## 2017-10-03 MED ORDER — AZITHROMYCIN 250 MG PO TABS
500.0000 mg | ORAL_TABLET | Freq: Once | ORAL | Status: AC
  Administered 2017-10-03: 500 mg via ORAL
  Filled 2017-10-03: qty 2

## 2017-10-03 MED ORDER — PSEUDOEPHEDRINE HCL 60 MG PO TABS
60.0000 mg | ORAL_TABLET | Freq: Once | ORAL | Status: AC
  Administered 2017-10-03: 60 mg via ORAL
  Filled 2017-10-03: qty 1

## 2017-10-03 NOTE — ED Provider Notes (Signed)
[facility] EMERGENCY DEPARTMENT Provider Note   CSN: [id number] Arrival date & time: 10/03/17  1008     History   Chief Complaint Chief Complaint  Patient presents with  . Chills    HPI Joe [name] is a 47 y.o. male.  The history is provided by the patient.  URI   This is a new problem. The current episode started 2 days ago. The problem has been gradually worsening. Associated symptoms include congestion and cough. Pertinent negatives include no chest pain, no abdominal pain, no nausea, no dysuria, no sneezing, no neck pain and no wheezing. Associated symptoms comments: Post nasal drip. He has tried NSAIDs (OTC cold tabs) for the symptoms.    Past Medical History:  Diagnosis Date  . Atrial fibrillation ([place])   . Dysrhythmia   . Hematuria   . Kidney stones   . Lung collapse    partial collasped lung  . Pneumonia     Patient Active Problem List   Diagnosis Date Noted  . Rectal bleeding 08/12/2016    Past Surgical History:  Procedure Laterality Date  . CHEST TUBE INSERTION    . COLONOSCOPY WITH PROPOFOL N/A 09/28/2016   Procedure: COLONOSCOPY WITH PROPOFOL;  Surgeon: Daneil Dolin, MD;  [facility];  Service: Endoscopy;  Laterality: N/A;  1215  . POLYPECTOMY  09/28/2016   Procedure: POLYPECTOMY;  Surgeon: Daneil Dolin, MD;  [facility]: [facility];  Service: Endoscopy;;  sigmoid colon polyps times 3  cs and splenic flexure polyp        Home Medications    Prior to Admission medications   Medication Sig Start Date End Date Taking? Authorizing Provider  aspirin 81 MG chewable tablet Chew 1 tablet (81 mg total) by mouth daily. 08/16/16   Rancour, Annie Main, MD  ibuprofen (ADVIL,MOTRIN) 600 MG tablet Take 1 tablet (600 mg total) by mouth every 6 (six) hours as needed. 11/20/16   Evalee Jefferson, PA-C  ketorolac (TORADOL) 10 MG tablet Take 10 mg by mouth every 6 (six) hours as needed for moderate pain.    [provider]  metoprolol tartrate  (LOPRESSOR) 50 MG tablet Take 1 tablet (50 mg total) by mouth 2 (two) times daily. 08/16/16   Rancour, Annie Main, MD  oxyCODONE-acetaminophen (PERCOCET/ROXICET) 5-325 MG tablet Take 1 tablet by mouth every 6 (six) hours as needed. 11/20/16   Evalee Jefferson, PA-C  peg 3350 powder (MOVIPREP) 100 g SOLR Take 1 kit (200 g total) by mouth as directed. 08/12/16   Rourk, Cristopher Estimable, MD    Family History Family History  Problem Relation Age of Onset  . Colon cancer Neg Hx   . Liver disease Neg Hx     Social History Social History   Tobacco Use  . Smoking status: Current Every Day Smoker    Packs/day: 0.50    Types: Cigarettes  . Smokeless tobacco: Never Used  Substance Use Topics  . Alcohol use: Yes    Comment: socially, on weekends a pint  . Drug use: No     Allergies   Patient has no known allergies.   Review of Systems Review of Systems  Constitutional: Positive for chills. Negative for activity change.       All ROS Neg except as noted in HPI  HENT: Positive for congestion. Negative for nosebleeds and sneezing.   Eyes: Negative for photophobia and discharge.  Respiratory: Positive for cough. Negative for shortness of breath and wheezing.   Cardiovascular: Negative for  chest pain and palpitations.  Gastrointestinal: Negative for abdominal pain, blood in stool and nausea.  Genitourinary: Negative for dysuria, frequency and hematuria.  Musculoskeletal: Negative for arthralgias, back pain and neck pain.  Skin: Negative.   Neurological: Negative for dizziness, seizures and speech difficulty.  Psychiatric/Behavioral: Negative for confusion and hallucinations.     Physical Exam Updated Vital Signs BP 138/87 (BP Location: Left Arm)   Pulse 100   Temp 99 F (37.2 C) (Oral)   Resp 18   Ht '6\' 1"'  (1.854 m)   Wt 108.9 kg (240 lb)   SpO2 96%   BMI 31.66 kg/m   Physical Exam  Constitutional: He is oriented to person, place, and time. He appears well-developed and well-nourished.   Non-toxic appearance.  HENT:  Head: Normocephalic.  Right Ear: Tympanic membrane and external ear normal.  Left Ear: Tympanic membrane and external ear normal.  There is nasal congestion present.  The airway is patent.  The speech is clear.  Eyes: Pupils are equal, round, and reactive to light. EOM and lids are normal.  Neck: Normal range of motion. Neck supple. Carotid bruit is not present.  Cardiovascular: Normal rate, regular rhythm, normal heart sounds, intact distal pulses and normal pulses.  Pulmonary/Chest: No stridor. No respiratory distress. He has no wheezes.  Coarse breath sounds with few rhonchi present.  There is symmetrical rise and fall of the chest.  The patient speaks in complete sentences without problem.  Abdominal: Soft. Bowel sounds are normal. There is no tenderness. There is no guarding.  Musculoskeletal: Normal range of motion.  Lymphadenopathy:       Head (right side): No submandibular adenopathy present.       Head (left side): No submandibular adenopathy present.    He has no cervical adenopathy.  Neurological: He is alert and oriented to person, place, and time. He has normal strength. No cranial nerve deficit or sensory deficit.  Skin: Skin is warm and dry. No rash noted.  Psychiatric: He has a normal mood and affect. His speech is normal.  Nursing note and vitals reviewed.    ED Treatments / Results  Labs (all labs ordered are listed, but only abnormal results are displayed) Labs Reviewed - No data to display  EKG None  Radiology No results found.  Procedures Procedures (including critical care time)  Medications Ordered in ED Medications - No data to display   Initial Impression / Assessment and Plan / ED Course  I have reviewed the triage vital signs and the nursing notes.  Pertinent labs & imaging results that were available during my care of the patient were reviewed by me and considered in my medical decision making (see chart for  details).       Final Clinical Impressions(s) / ED Diagnoses MDM  Vital signs reviewed.  Pulse oximetry is 98% on room air. Patient has symptoms and findings consistent with bronchitis and upper respiratory infection.  The patient speaks in complete sentences without problem.  He has not had high fevers, there is been no unusual rashes.  No recent tick bites reported.  Patient has not been out of the country recently.  The patient is a smoker and with the diagnosis of a bronchitis will be treated with Zithromax.  Will also use Decadron for about 5 days.  Patient given an inhaler to use if he should have problems with breathing or wheezing.  Patient will use Tylenol extra strength for any temperature elevation.  We also discussed the  need for good hydration.  Patient is in agreement with this plan.  I encouraged the patient to stop smoking.   Final diagnoses:  Acute bronchitis, unspecified organism  Upper respiratory tract infection, unspecified type    ED Discharge Orders        Ordered    azithromycin (ZITHROMAX) 250 MG tablet     10/03/17 1108    dexamethasone (DECADRON) 4 MG tablet  2 times daily with meals     10/03/17 1108       Lily Kocher, PA-C 10/04/17 3200    Nat Christen, MD 10/04/17 1104

## 2017-10-03 NOTE — Discharge Instructions (Addendum)
Your temperature is 99, the remainder of your vital signs are within normal limits.  Your oxygen level is 96% on room air.  Your examination favors bronchitis and an upper respiratory infection.  Please increase fluids.  Please wash hands frequently.  Keep your distance from others as this can be contagious.  Please use 1000 mg of Tylenol every 4 hours, or 600 to 800 mg of ibuprofen every 6 hours for the next 3 days, then on an as-needed basis.  You may find that Claritin-D is helpful in decreasing the congestion in your sinus passages, as well as decreasing the drip in the back of your throat and cough.  Please use Decadron 2 times daily with food until all taken.  Use azithromycin 1 tablet daily with food starting on tomorrow, July 17.  Please see Dr. Maudie Mercury or return to the emergency department if not improving.  Please stop smoking.

## 2017-10-03 NOTE — ED Triage Notes (Addendum)
Pt c/o cold chills, sweating and bodyaches since Sunday. Pt reports this morning around 0915 he experienced numbness in bilateral hands that lasted for about 30 minutes. Pt reports the numbness has now resolved. Pt reports "some" weakness, no unilateral weakness. Pt did not notice any weakness/numbess in legs or face, or change in speech. Pt has used Ibuprofen at home and he says about 30 minutes later he starts to feel better.   Dr. Lacinda Axon made aware of pt's bilateral hand numbness.

## 2017-12-17 ENCOUNTER — Encounter (HOSPITAL_COMMUNITY): Payer: Self-pay | Admitting: Emergency Medicine

## 2017-12-17 ENCOUNTER — Emergency Department (HOSPITAL_COMMUNITY)
Admission: EM | Admit: 2017-12-17 | Discharge: 2017-12-17 | Disposition: A | Discharge status: Home / Self Care | Payer: Managed Care, Other (non HMO) | Attending: Emergency Medicine | Admitting: Emergency Medicine

## 2017-12-17 ENCOUNTER — Other Ambulatory Visit: Payer: Self-pay

## 2017-12-17 DIAGNOSIS — S50812A Abrasion of left forearm, initial encounter: Secondary | ICD-10-CM | POA: Diagnosis not present

## 2017-12-17 DIAGNOSIS — S80811A Abrasion, right lower leg, initial encounter: Secondary | ICD-10-CM | POA: Diagnosis not present

## 2017-12-17 DIAGNOSIS — Y929 Unspecified place or not applicable: Secondary | ICD-10-CM | POA: Diagnosis not present

## 2017-12-17 DIAGNOSIS — Z79899 Other long term (current) drug therapy: Secondary | ICD-10-CM | POA: Insufficient documentation

## 2017-12-17 DIAGNOSIS — Z7982 Long term (current) use of aspirin: Secondary | ICD-10-CM | POA: Insufficient documentation

## 2017-12-17 DIAGNOSIS — S80812A Abrasion, left lower leg, initial encounter: Secondary | ICD-10-CM | POA: Diagnosis not present

## 2017-12-17 DIAGNOSIS — Z23 Encounter for immunization: Secondary | ICD-10-CM | POA: Insufficient documentation

## 2017-12-17 DIAGNOSIS — Y999 Unspecified external cause status: Secondary | ICD-10-CM | POA: Diagnosis not present

## 2017-12-17 DIAGNOSIS — S8992XA Unspecified injury of left lower leg, initial encounter: Secondary | ICD-10-CM | POA: Diagnosis present

## 2017-12-17 DIAGNOSIS — Y9355 Activity, bike riding: Secondary | ICD-10-CM | POA: Diagnosis not present

## 2017-12-17 DIAGNOSIS — F17210 Nicotine dependence, cigarettes, uncomplicated: Secondary | ICD-10-CM | POA: Diagnosis not present

## 2017-12-17 MED ORDER — TETANUS-DIPHTH-ACELL PERTUSSIS 5-2.5-18.5 LF-MCG/0.5 IM SUSP
0.5000 mL | Freq: Once | INTRAMUSCULAR | Status: AC
  Administered 2017-12-17: 0.5 mL via INTRAMUSCULAR
  Filled 2017-12-17: qty 0.5

## 2017-12-17 MED ORDER — HYDROCODONE-ACETAMINOPHEN 5-325 MG PO TABS: 2.0000 | ORAL_TABLET | ORAL | 0 refills | Status: DC | PRN
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
## 2017-12-17 NOTE — ED Notes (Signed)
Left elbow, right shin, left ankle- abrasions cleansed with wound cleanser, nonstick dressings applied, wrapped with kerlix and secured with tape.

## 2017-12-17 NOTE — Discharge Instructions (Addendum)
You can clean the wounds with the wound cleanser provided or with mild soap and water.  Keep them bandaged for the first 2 to 3 days.  Take over-the-counter ibuprofen 600 mg every 6-8 hours.  Follow-up with your primary doctor or return to the ER for any worsening symptoms or signs of infection.

## 2017-12-18 MED FILL — Hydrocodone-Acetaminophen Tab 5-325 MG: ORAL | Qty: 6 | Status: AC
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
## 2017-12-20 NOTE — ED Notes (Addendum)
Pt called requesting note for light duty. 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 Pt reports he hasnt followed up with PCP. Pt will be given light duty note to cover him until Friday  Pt called

## 2018-07-19 ENCOUNTER — Other Ambulatory Visit: Payer: Self-pay

## 2018-07-19 ENCOUNTER — Emergency Department (HOSPITAL_COMMUNITY)
Admission: EM | Admit: 2018-07-19 | Discharge: 2018-07-19 | Disposition: A | Discharge status: Home / Self Care | Payer: Managed Care, Other (non HMO) | Attending: Emergency Medicine | Admitting: Emergency Medicine

## 2018-07-19 ENCOUNTER — Emergency Department (HOSPITAL_COMMUNITY): Payer: Managed Care, Other (non HMO)

## 2018-07-19 ENCOUNTER — Encounter (HOSPITAL_COMMUNITY): Payer: Self-pay | Admitting: Emergency Medicine

## 2018-07-19 DIAGNOSIS — Z0279 Encounter for issue of other medical certificate: Secondary | ICD-10-CM | POA: Insufficient documentation

## 2018-07-19 DIAGNOSIS — R05 Cough: Secondary | ICD-10-CM | POA: Insufficient documentation

## 2018-07-19 DIAGNOSIS — Z7689 Persons encountering health services in other specified circumstances: Secondary | ICD-10-CM

## 2018-07-19 DIAGNOSIS — Z7982 Long term (current) use of aspirin: Secondary | ICD-10-CM | POA: Diagnosis not present

## 2018-07-19 DIAGNOSIS — F17210 Nicotine dependence, cigarettes, uncomplicated: Secondary | ICD-10-CM | POA: Diagnosis not present

## 2018-07-19 NOTE — ED Triage Notes (Signed)
Pt states being sick since Monday.  States " I need my temp check and a work note so my work will let me go back to work"

## 2018-07-19 NOTE — Discharge Instructions (Addendum)
You are seen in the ER for return to work evaluation. You informed us that you were sick on Monday, but have been completely fine Tuesday, Wednesday and today. Clinical concerns for COVID-19 infection is low, however we would like you to follow the standard guidelines of not returning to work for at least 3 days once your symptoms have resolved. You must stop work immediately if you start developing fevers, chills, body aches, cough, shortness of breath, loss of taste or severe fatigue.

## 2018-07-19 NOTE — ED Provider Notes (Signed)
[facility] EMERGENCY DEPARTMENT Provider Note   CSN: [id number] Arrival date & time: 07/19/18  6834    History   Chief Complaint Chief Complaint  Patient presents with  . Follow-up    HPI SENDER RUEB is a 48 y.o. male.     HPI 48 year old male comes in with chief complaint of turn to work evaluation. Patient has history of A. Fib and reports that he was having weakness and chills on Monday while at work.  They checked his temperature and he was afebrile.  Patient was advised to return home.  He started feeling well later that evening and was back to normal on Tuesday.  Today he wanted to go work, however he was advised to come to the ER for work note.  Patient denies any rhinorrhea, sore throat, body aches, sweats, fevers, nonproductive cough, shortness of breath or known contacts with someone sick with COVID-19.  He reports that his chills and weakness resolved the same day that he had to return from work.  Past Medical History:  Diagnosis Date  . Atrial fibrillation ([place])   . Dysrhythmia   . Hematuria   . Kidney stones   . Lung collapse    partial collasped lung  . Pneumonia     Patient Active Problem List   Diagnosis Date Noted  . Rectal bleeding 08/12/2016    Past Surgical History:  Procedure Laterality Date  . CHEST TUBE INSERTION    . COLONOSCOPY WITH PROPOFOL N/A 09/28/2016   Procedure: COLONOSCOPY WITH PROPOFOL;  Surgeon: Daneil Dolin, MD;  [facility];  Service: Endoscopy;  Laterality: N/A;  1215  . POLYPECTOMY  09/28/2016   Procedure: POLYPECTOMY;  Surgeon: Daneil Dolin, MD;  Location: [facility];  Service: Endoscopy;;  sigmoid colon polyps times 3  cs and splenic flexure polyp        Home Medications    Prior to Admission medications   Medication Sig Start Date End Date Taking? Authorizing Provider  aspirin 81 MG chewable tablet Chew 1 tablet (81 mg total) by mouth daily. 08/16/16   Rancour, Annie Main,  MD  azithromycin (ZITHROMAX) 250 MG tablet 1 po daily starting 7/17 10/03/17   Lily Kocher, PA-C  dexamethasone (DECADRON) 4 MG tablet Take 1 tablet (4 mg total) by mouth 2 (two) times daily with a meal. 10/03/17   Lily Kocher, PA-C  HYDROcodone-acetaminophen (NORCO/VICODIN) 5-325 MG tablet Take 2 tablets by mouth every 4 (four) hours as needed. 12/17/17   Triplett, Tammy, PA-C  ibuprofen (ADVIL,MOTRIN) 600 MG tablet Take 1 tablet (600 mg total) by mouth every 6 (six) hours as needed. 11/20/16   Evalee Jefferson, PA-C  ketorolac (TORADOL) 10 MG tablet Take 10 mg by mouth every 6 (six) hours as needed for moderate pain.    [provider]  metoprolol tartrate (LOPRESSOR) 50 MG tablet Take 1 tablet (50 mg total) by mouth 2 (two) times daily. 08/16/16   Rancour, Annie Main, MD  oxyCODONE-acetaminophen (PERCOCET/ROXICET) 5-325 MG tablet Take 1 tablet by mouth every 6 (six) hours as needed. 11/20/16   Evalee Jefferson, PA-C  peg 3350 powder (MOVIPREP) 100 g SOLR Take 1 kit (200 g total) by mouth as directed. 08/12/16   Rourk, Cristopher Estimable, MD    Family History Family History  Problem Relation Age of Onset  . Colon cancer Neg Hx   . Liver disease Neg Hx     Social History Social History   Tobacco Use  .  Smoking status: Current Every Day Smoker    Packs/day: 0.50    Types: Cigarettes  . Smokeless tobacco: Never Used  Substance Use Topics  . Alcohol use: Yes    Comment: socially, on weekends a pint  . Drug use: No     Allergies   Patient has no known allergies.   Review of Systems Review of Systems  Constitutional: Negative for chills, fatigue and fever.  HENT: Negative for congestion, rhinorrhea, sneezing and sore throat.   Respiratory: Negative for cough and shortness of breath.   Gastrointestinal: Negative for abdominal pain, nausea and vomiting.  Musculoskeletal: Negative for myalgias.  Allergic/Immunologic: Negative for immunocompromised state.     Physical Exam Updated Vital Signs  BP (!) 123/97   Pulse 73   Temp 98.8 F (37.1 C)   Resp 18   Ht '6\' 1"'  (1.854 m)   Wt 104.3 kg   SpO2 98%   BMI 30.34 kg/m   Physical Exam Vitals signs and nursing note reviewed.  Constitutional:      Appearance: He is well-developed.  HENT:     Head: Atraumatic.  Neck:     Musculoskeletal: Neck supple.  Cardiovascular:     Rate and Rhythm: Normal rate.  Pulmonary:     Effort: Pulmonary effort is normal.  Skin:    General: Skin is warm.  Neurological:     Mental Status: He is alert and oriented to person, place, and time.      ED Treatments / Results  Labs (all labs ordered are listed, but only abnormal results are displayed) Labs Reviewed - No data to display  EKG None  Radiology No results found.  Procedures Procedures (including critical care time)  Medications Ordered in ED Medications - No data to display   Initial Impression / Assessment and Plan / ED Course  I have reviewed the triage vital signs and the nursing notes.  Pertinent labs & imaging results that were available during my care of the patient were reviewed by me and considered in my medical decision making (see chart for details).        48 year old comes in a chief complaint of return to work evaluation. He informs me that on Monday he was feeling sick, but he started feeling better that night itself.  He has had no symptoms on Tuesday and Wednesday and wanted to return to work today (he was off on Tuesday and Wednesday), but he was advised to get a work note.  Besides the subjective myalgias and chills that he had on Monday he had no other URI, LRI symptoms.  He also had no GI symptoms and at no point did he have fevers, nonproductive cough or shortness of breath.  He has clear lung exam.  Hemodynamically he is stable here and not having any tachypnea, tachycardia or fevers.  I informed him that there is no way of his time that he does not have COVID-19 at this point and that he does  not meet the criteria for outpatient testing from the ER.  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  However, he has been informed that it is his responsibility to stop working immediately if he starts developing any of the symptoms that we inquired about covering the upper respiratory,  lower respiratory, GI tract and nonspecific systemic symptoms. Patient is in agreement with the plan.  Additionally reported some collarbone pain that he has been having since he injured his shoulder while at work several weeks ago.  He continues to have very focal pain over his clavicle.  X-ray of his chest does not reveal any clavicular abnormality.  Patient has been informed of the x-ray results, and he has been made aware that x-rays are equivocal for diagnosis of COVID-19.  Joe [name] was evaluated in Emergency Department on 07/19/2018 for the symptoms described in the history of present illness. He was evaluated in the context of the global COVID-19 pandemic, which necessitated consideration that the patient might be at risk for infection with the SARS-CoV-2 virus that causes COVID-19. Institutional protocols and algorithms that pertain to the evaluation of patients at risk for COVID-19 are in a state of rapid change based on information released by regulatory bodies including the CDC and federal and state organizations. These policies and algorithms were followed during the patient's care in the ED.   Final Clinical Impressions(s) / ED Diagnoses   Final diagnoses:  Return to work evaluation    ED Discharge Orders    None       Varney Biles, MD 07/19/18 [phone]

## 2019-04-09 ENCOUNTER — Other Ambulatory Visit: Payer: Self-pay

## 2019-04-09 ENCOUNTER — Ambulatory Visit: Payer: Managed Care, Other (non HMO) | Attending: Internal Medicine

## 2019-04-09 DIAGNOSIS — Z20822 Contact with and (suspected) exposure to covid-19: Secondary | ICD-10-CM

## 2019-04-10 LAB — NOVEL CORONAVIRUS, NAA: SARS-CoV-2, NAA: NOT DETECTED

## 2019-04-11 ENCOUNTER — Telehealth: Payer: Self-pay | Admitting: Internal Medicine

## 2019-04-11 NOTE — Telephone Encounter (Signed)
Negative COVID results given. Patient results "NOT Detected." Caller expressed understanding. ° °

## 2019-09-27 ENCOUNTER — Encounter (HOSPITAL_COMMUNITY): Payer: Self-pay

## 2019-09-27 ENCOUNTER — Emergency Department (HOSPITAL_COMMUNITY)
Admission: EM | Admit: 2019-09-27 | Discharge: 2019-09-28 | Disposition: A | Discharge status: Home / Self Care | Payer: Managed Care, Other (non HMO) | Attending: Emergency Medicine | Admitting: Emergency Medicine

## 2019-09-27 ENCOUNTER — Other Ambulatory Visit: Payer: Self-pay

## 2019-09-27 ENCOUNTER — Emergency Department (HOSPITAL_COMMUNITY): Payer: Managed Care, Other (non HMO)

## 2019-09-27 DIAGNOSIS — Y9289 Other specified places as the place of occurrence of the external cause: Secondary | ICD-10-CM | POA: Insufficient documentation

## 2019-09-27 DIAGNOSIS — W1789XA Other fall from one level to another, initial encounter: Secondary | ICD-10-CM | POA: Diagnosis not present

## 2019-09-27 DIAGNOSIS — S00511A Abrasion of lip, initial encounter: Secondary | ICD-10-CM | POA: Diagnosis not present

## 2019-09-27 DIAGNOSIS — Y999 Unspecified external cause status: Secondary | ICD-10-CM | POA: Insufficient documentation

## 2019-09-27 DIAGNOSIS — S52371A Galeazzi's fracture of right radius, initial encounter for closed fracture: Secondary | ICD-10-CM

## 2019-09-27 DIAGNOSIS — S40212A Abrasion of left shoulder, initial encounter: Secondary | ICD-10-CM | POA: Diagnosis not present

## 2019-09-27 DIAGNOSIS — F17210 Nicotine dependence, cigarettes, uncomplicated: Secondary | ICD-10-CM | POA: Diagnosis not present

## 2019-09-27 DIAGNOSIS — S20319A Abrasion of unspecified front wall of thorax, initial encounter: Secondary | ICD-10-CM | POA: Diagnosis not present

## 2019-09-27 DIAGNOSIS — Y9351 Activity, roller skating (inline) and skateboarding: Secondary | ICD-10-CM | POA: Insufficient documentation

## 2019-09-27 DIAGNOSIS — S0993XA Unspecified injury of face, initial encounter: Secondary | ICD-10-CM | POA: Diagnosis present

## 2019-09-27 DIAGNOSIS — S0091XA Abrasion of unspecified part of head, initial encounter: Secondary | ICD-10-CM | POA: Insufficient documentation

## 2019-09-27 LAB — COMPREHENSIVE METABOLIC PANEL
ALT: 34 U/L (ref 0–44)
AST: 43 U/L — ABNORMAL HIGH (ref 15–41)
Albumin: 3.9 g/dL (ref 3.5–5.0)
Alkaline Phosphatase: 52 U/L (ref 38–126)
Anion gap: 11 (ref 5–15)
BUN: 17 mg/dL (ref 6–20)
CO2: 22 mmol/L (ref 22–32)
Calcium: 8.7 mg/dL — ABNORMAL LOW (ref 8.9–10.3)
Chloride: 106 mmol/L (ref 98–111)
Creatinine, Ser: 0.95 mg/dL (ref 0.61–1.24)
GFR calc Af Amer: >60 mL/min (ref >60)
GFR calc non Af Amer: >60 mL/min (ref >60)
Glucose, Bld: 118 mg/dL — ABNORMAL HIGH (ref 70–99)
Potassium: 3.8 mmol/L (ref 3.5–5.1)
Sodium: 139 mmol/L (ref 135–145)
Total Bilirubin: 0.5 mg/dL (ref 0.3–1.2)
Total Protein: 7.2 g/dL (ref 6.5–8.1)

## 2019-09-27 LAB — ETHANOL: Alcohol, Ethyl (B): 180 mg/dL — ABNORMAL HIGH (ref <10)

## 2019-09-27 LAB — CBC
HCT: 41.6 % (ref 39.0–52.0)
Hemoglobin: 13.5 g/dL (ref 13.0–17.0)
MCH: 28.8 pg (ref 26.0–34.0)
MCHC: 32.5 g/dL (ref 30.0–36.0)
MCV: 88.7 fL (ref 80.0–100.0)
Platelets: 292 10*3/uL (ref 150–400)
RBC: 4.69 MIL/uL (ref 4.22–5.81)
RDW: 14.8 % (ref 11.5–15.5)
WBC: 13.5 10*3/uL — ABNORMAL HIGH (ref 4.0–10.5)
nRBC: 0 % (ref 0.0–0.2)

## 2019-09-27 MED ORDER — IOHEXOL 300 MG/ML  SOLN
100.0000 mL | Freq: Once | INTRAMUSCULAR | Status: AC | PRN
  Administered 2019-09-27: 100 mL via INTRAVENOUS

## 2019-09-27 MED ORDER — SODIUM CHLORIDE 0.9 % IV BOLUS
1000.0000 mL | Freq: Once | INTRAVENOUS | Status: AC
  Administered 2019-09-27: 1000 mL via INTRAVENOUS

## 2019-09-27 MED ORDER — KETAMINE HCL 10 MG/ML IJ SOLN
1.0000 mg/kg | Freq: Once | INTRAMUSCULAR | Status: AC
  Administered 2019-09-27: 100 mg via INTRAVENOUS
  Filled 2019-09-27: qty 1

## 2019-09-27 MED ORDER — PROPOFOL 10 MG/ML IV BOLUS
1.0000 mg/kg | Freq: Once | INTRAVENOUS | Status: AC
  Administered 2019-09-27: 80 mg via INTRAVENOUS
  Filled 2019-09-27: qty 20

## 2019-09-27 MED ORDER — FENTANYL CITRATE (PF) 100 MCG/2ML IJ SOLN
25.0000 ug | Freq: Once | INTRAMUSCULAR | Status: AC
  Administered 2019-09-27: 25 ug via INTRAVENOUS
  Filled 2019-09-27: qty 2
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
## 2019-09-27 NOTE — Sedation Documentation (Signed)
Family updated as to patient's status.

## 2019-09-27 NOTE — Sedation Documentation (Signed)
Medication dose calculated and verified for: right wrist reduction

## 2019-09-27 NOTE — Sedation Documentation (Signed)
Vital signs stable. 
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
## 2019-09-28 MED ORDER — HYDROMORPHONE HCL 1 MG/ML IJ SOLN
1.0000 mg | Freq: Once | INTRAMUSCULAR | Status: AC
  Administered 2019-09-28: 1 mg via INTRAVENOUS
  Filled 2019-09-28: qty 1

## 2019-09-28 MED ORDER — OXYCODONE HCL 5 MG PO TABS: 5.0000 mg | ORAL_TABLET | ORAL | 0 refills | Status: DC | PRN

## 2019-09-28 NOTE — Discharge Instructions (Signed)
You were evaluated in the Emergency Department and after careful evaluation, we did not find any emergent condition requiring admission or further testing in the hospital.  You broke a bone in your arm and this injury will need surgery.  Please call Dr. Tama Headings office first thing Monday, he should be able to be seen Monday in the office.  We recommend Tylenol and Motrin at home for discomfort, use the oxycodone medication as needed for more significant pain.  Please return to the Emergency Department if you experience any worsening of your condition.  We encourage you to follow up with a primary care provider.  Thank you for allowing [place] to be a part of your care.
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
## 2019-09-30 ENCOUNTER — Ambulatory Visit: Payer: Self-pay | Admitting: Student

## 2019-09-30 DIAGNOSIS — S52372A Galeazzi's fracture of left radius, initial encounter for closed fracture: Secondary | ICD-10-CM | POA: Insufficient documentation

## 2019-09-30 NOTE — H&P (Signed)
Orthopaedic Trauma Service (OTS) H&P  Patient ID: HISHAM PROVENCE MRN: [id number] DOB/AGE: 1970-04-23 49 y.o.  Reason for Surgery: Left radius fracture  HPI: ZAEDYN COVIN is an 49 y.o. male presenting for surgery of left radius fracture.  Patient presented to [facility] emergency department on 09/27/2019 after sustaining a fall riding a hover board.  [place] forward landing on his face, left arm, chest.  Imaging in the emergency department showed a left radial shaft fracture with obvious disruption of the DRUJ.  Orthopedics was consulted who recommended fracture reduction by ED with placement of splint.  Fracture was successfully reduced by EDP and patient was placed in a long-arm splint with instructions to follow-up with orthopedics as outpatient to discuss surgical management.  Patient seen in [place] clinic on 09/30/2019.  He presents today for surgical fixation of his fracture.  He denies any significant numbness or tingling through the extremity.  Denies any other injuries related to the fall.  Other than history of A. fib (not currently on medication), patient has no other significant past medical history.  Patient currently smokes about a half a pack of cigarettes per day.  Is a social drinker, consuming approximately a pint on weekends.  Patient currently unemployed.  Past Medical History:  Diagnosis Date  . Atrial fibrillation ([place])   . Dysrhythmia   . Hematuria   . Kidney stones   . Lung collapse    partial collasped lung  . Pneumonia     Past Surgical History:  Procedure Laterality Date  . CHEST TUBE INSERTION    . COLONOSCOPY WITH PROPOFOL N/A 09/28/2016   Procedure: COLONOSCOPY WITH PROPOFOL;  Surgeon: Daneil Dolin, MD;  Location: [facility];  Service: Endoscopy;  Laterality: N/A;  1215  . POLYPECTOMY  09/28/2016   Procedure: POLYPECTOMY;  Surgeon: Daneil Dolin, MD;  Location: [facility];  Service: Endoscopy;;  sigmoid colon polyps times 3  cs and splenic flexure  polyp    Family History  Problem Relation Age of Onset  . Colon cancer Neg Hx   . Liver disease Neg Hx     Social History:  reports that he has been smoking cigarettes. He has been smoking about 0.50 packs per day. He has never used smokeless tobacco. He reports current alcohol use. He reports that he does not use drugs.  Allergies: No Known Allergies  Medications:  Prior to Admission:  No medications prior to admission.    ROS: Constitutional: No fever or chills Vision: No changes in vision ENT: No difficulty swallowing CV: No chest pain Pulm: No SOB or wheezing GI: No nausea or vomiting GU: No urgency or inability to hold urine Skin: No poor wound healing Neurologic: No numbness or tingling Psychiatric: No depression or anxiety Heme: No bruising Allergic: No reaction to medications or food   Exam: There were no vitals taken for this visit. General: No acute distress Orientation: Alert and oriented x3 Mood and Affect: Mood and affect appropriate, pleasant and cooperative Gait: Normal reciprocal gait Coordination and balance: Within normal limits  Left upper extremity: Splint in place.  Nontender above splint.  Able to wiggle fingers.  Sensation is intact to the radial, median, ulnar nerve distribution.  Hand is warm and well-perfused.  Brisk cap refill  Right upper extremity: Skin without lesions. No tenderness to palpation. Full painless ROM, full strength in each muscle groups without evidence of instability.   Medical Decision Making: Data: Imaging: AP and lateral views of the  left forearm show midshaft radius fracture with dorsal displacement and angulation  Labs:  Results for orders placed or performed during the hospital encounter of 09/27/19 (from the past 168 hour(s))  Ethanol   Collection Time: 09/27/19  9:58 PM  Result Value Ref Range   Alcohol, Ethyl (B) 180 (H) <10 mg/dL  CBC   Collection Time: 09/27/19  9:58 PM  Result Value Ref Range   WBC 13.5  (H) 4.0 - 10.5 K/uL   RBC 4.69 4.22 - 5.81 MIL/uL   Hemoglobin 13.5 13.0 - 17.0 g/dL   HCT 41.6 39 - 52 %   MCV 88.7 80.0 - 100.0 fL   MCH 28.8 26.0 - 34.0 pg   MCHC 32.5 30.0 - 36.0 g/dL   RDW 14.8 11.5 - 15.5 %   Platelets 292 150 - 400 K/uL   nRBC 0.0 0.0 - 0.2 %  Comprehensive metabolic panel   Collection Time: 09/27/19  9:58 PM  Result Value Ref Range   Sodium 139 135 - 145 mmol/L   Potassium 3.8 3.5 - 5.1 mmol/L   Chloride 106 98 - 111 mmol/L   CO2 22 22 - 32 mmol/L   Glucose, Bld 118 (H) 70 - 99 mg/dL   BUN 17 6 - 20 mg/dL   Creatinine, Ser 0.95 0.61 - 1.24 mg/dL   Calcium 8.7 (L) 8.9 - 10.3 mg/dL   Total Protein 7.2 6.5 - 8.1 g/dL   Albumin 3.9 3.5 - 5.0 g/dL   AST 43 (H) 15 - 41 U/L   ALT 34 0 - 44 U/L   Alkaline Phosphatase 52 38 - 126 U/L   Total Bilirubin 0.5 0.3 - 1.2 mg/dL   GFR calc non Af Amer >60 >60 mL/min   GFR calc Af Amer >60 >60 mL/min   Anion gap 11 5 - 15    Assessment/Plan: 49 year old male status post fall, resulting in left midshaft radius fracture with DRUJ disruption.  Patient with significant injury to left arm which will require surgical fixation.  Recommend proceeding with open reduction internal fixation of the radius with stabilization of the DRUJ.  Risk and benefits of procedure discussed with patient. Risks discussed included bleeding, infection, malunion, nonunion, damage to surrounding nerves and blood vessels, pain, hardware prominence or irritation, hardware failure, stiffness, and associated anesthesia complications.  All questions and concerns were addressed to patient's satisfaction, he agrees agrees to proceed with surgery.  Consent will be obtained.  Dellene Mcgroarty A. Carmie Kanner [facility] [phone] (office) [email or web address]

## 2019-10-01 ENCOUNTER — Encounter (HOSPITAL_COMMUNITY): Payer: Self-pay | Admitting: Student

## 2019-10-01 ENCOUNTER — Other Ambulatory Visit: Payer: Self-pay

## 2019-10-01 ENCOUNTER — Other Ambulatory Visit (HOSPITAL_COMMUNITY)
Admission: RE | Admit: 2019-10-01 | Discharge: 2019-10-01 | Disposition: A | Discharge status: Home / Self Care | Payer: Managed Care, Other (non HMO) | Source: Ambulatory Visit | Attending: Student | Admitting: Student

## 2019-10-01 DIAGNOSIS — Z20822 Contact with and (suspected) exposure to covid-19: Secondary | ICD-10-CM | POA: Insufficient documentation

## 2019-10-01 DIAGNOSIS — Z01812 Encounter for preprocedural laboratory examination: Secondary | ICD-10-CM | POA: Diagnosis present

## 2019-10-01 LAB — SARS CORONAVIRUS 2 (TAT 6-24 HRS): SARS Coronavirus 2: NEGATIVE

## 2019-10-01 NOTE — Progress Notes (Addendum)
Mr. Delfin denies chest pain or shortness of breath. Patient was tested for Covid today and is in quarantine with his family. MKr. Broadax denies any s/s of Covid and denies being around anyone with s/s.  Mr. Freimark was seen in [facility] ED 01/21/2016 with complaints of sudden onset of palpations.EKG showed Afib with RVR. Patient was treated with IV Cardizem, he was seen by Dr. Ellyn Hack who started  patient on Metoprolol and low dose Aspirin and a referral to [facility]. Patient did not have prescription filled, did not start ASA and did not see a cardiologist.   Mr. Laws went to [facility] on 08/16/2016 for a "second opinion"     " He states approximately 27 hours ago while at work he developed dizziness, shortness of breath palpitations. He went to [facility] and was found to be in atrial fibrillation. He was apparently admitted for rate control. He decided to leave around 4 PM 12 hours after admission. He states one hour after leaving, he went back into normal rhythm. He denies any further episodes of shortness of breath, dizziness or palpitations. No chest pain. He came here today to make sure that he is back and regular rhythm. He states he feels "awesome". Copied from [facility] ED notes on 08/16/2016.  EKG at [facility] showed Sinus Rhythm. Mr Candelaria was discharged to with prescriptions for Metoprolol and ASA. Mr Brands did not have prescriptions filled, because he was not having palpations and felt great. Mr Crean states he has not experienced any palpitations since 07/2016. I requested EKG, and any record they can send from that visit. Mr Isaacson reports that PCP is Dr. Deboraha Sprang in [place], states he has not been there in along time. I asked patient if he had been to PCP's office since he was seen in ED with palpations, he reported that he has not.  I asked anesthesia PA- C to review chart.   The OR procedure has OR on the left arm  consent, xray and ED notes  have right arm.  I called Dr. Tama Headings office and spoke with Ria Comment, she said she will get the information to the scheduer so she can call Mildred scheduler and correct the site.

## 2019-10-01 NOTE — Progress Notes (Signed)
Anesthesia Chart Review: Joe [name]   Case: [id number] Date/Time: 10/02/19 0815   Procedure: OPEN REDUCTION INTERNAL FIXATION (ORIF) RADIAL FRACTURE (Right )   Anesthesia type: General   Diagnosis: Closed Galeazzi's fracture of left radius, initial encounter [S52.372A]   Pre-op diagnosis: RIGHT  Galeazzi fracture   Location: [facility] OR ROOM 06 / [place] OR   Surgeons: Shona Needles, MD      DISCUSSION: Patient is a 49 year old male scheduled for the above procedure. On 09/27/2019 he fell while riding his child's hover board. He sustained a RIGHT Galeazzi fracture s/p post reduction (with residual angulation) in ED. Splint placed with out-patient follow-up with Dr. Doreatha Martin arranged.  History includes smoking, afib (see below for details), pneumothorax (s/p CT, prior to 01/2016), childhood asthma.   He presented to [facility] ED on 01/21/16 with new onset afib with RVR in setting of fairly regular EtOH use (had drank 1/2 pint vodka the day before). Rate improved with IV diltiazem. He was evaluated by cardiologist Carlyle Dolly, MD in the ED and advised cutting back or stopping drinking. Recommended starting Lopressor and ASA 81 mg daily (CHADS2Vasc score then was 0). He held off on ordering echo and would reassess at follow-up which patient never had. He had a second [place] ED visit 08/16/16 after leaving [facility] where he was admitted 08/15/16 for afib with RVR, but signed out [place] after about 12 hours. He felt he had converted back to SR about a hour after he left (which was confirmed on 08/16/16 EKG). ED provider again encouraged metoprolol and ASA and previously prescribed by cardiology and also advised cardiology follow-up which has not happened. He did undergo colonoscopy on 09/28/16 (for + FOBT 07/17/16) that showed nonbleeding internal hemorrhoids, 4 to 7 mm polyps in the sigmoid colon and at the splenic flexure, removed with cold snare (non-malignant x2).  He says his PCP is Dr Everette Rank in [place],  but has not been seen there in a long time. 08/15/16 records reviewed for [facility]. He presented with afib with RVR at ~ 150 bpm. Symptoms were similar to his afib with RVR episode in 01/2016. He had associated SOB and dizziness but no chest pain. Rate improved after receiving 10mg  IV Lopressor and 10 mg IV diltiazem. Notes suggest he signed out AMA because he wanted to smoke and needed to work that night. He did not have a stress or echo done during his brief admission.    Patient reported that he has not experienced palpitations since 07/2016. By notes, drinking a pint of liquor on weekends. Last EKG available is from 08/16/16, so he will need updated EKG on arrival. Patient with PAF--possibly two years since last episode based on patient's lack of recurrent palpitations. There was consideration of echocardiogram which he never had, and he never followed through with cardiology follow-up. He is not on b-blocker or ASA. Unfortunately, now with acute right radial fracture on 09/27/19 and needs ORIF. Discussed with anesthesiologist Hoy Morn, MD. Preoperative cardiology evaluation recommended. I contacted Trish with [facility] who indicated earliest evaluation could be 8:00 AM, possibly later if more acute consults in-patient. I have notified Ria Comment at Dr. Tama Headings office indicating this, so 8:30 AM start is not likely. Also discussed with her that out-patient cardiology consult could also be considered if Dr. Doreatha Martin felt surgery could be postponed--otherwise will keep as scheduled and await cardiology input. Short Stay RN Flow Coordinator updated.   He will need EKG on arrival. He has a current CBC and  CMET from 09/27/19, so additional lab orders per surgeon. 10/01/19 presurgical COVID-19 test is in process.    VS: There were no vitals taken for this visit. BP on arrival to ED 09/27/19 was 139/128.  BP Readings from Last 3 Encounters:  09/28/19 129/80  07/19/18 (!) 123/97  12/17/17 (!) 144/83     PROVIDERS: Reported PCP as [facility] Donna, Brooke Bonito. MD--but said he has not be seen there is a long time.   LABS: Lab results from 09/27/19 include: Also ethyl alcohol 180.  Lab Results  Component Value Date   WBC 13.5 (H) 09/27/2019   HGB 13.5 09/27/2019   HCT 41.6 09/27/2019   PLT 292 09/27/2019   GLUCOSE 118 (H) 09/27/2019   ALT 34 09/27/2019   AST 43 (H) 09/27/2019   NA 139 09/27/2019   K 3.8 09/27/2019   CL 106 09/27/2019   CREATININE 0.95 09/27/2019   BUN 17 09/27/2019   CO2 22 09/27/2019      IMAGES: Post reduction right forearm xray 09/28/19: FINDINGS: There is dorsal displacement with anterior angulation at the midshaft radius fracture. Alignment is slightly improved. IMPRESSION: Improved alignment of midshaft radius fracture.   09/27/19 CT IMPRESSION: CT HEAD 1. No acute intracranial abnormality. 2. Minimal left frontal scalp thickening without hematoma or subjacent calvarial fracture.  CT MAXILLOFACIAL 1. No acute facial bone fracture. 2. Numerous absent dentition. 3. Small periapical lucency around the second left maxillary molar possibly contributing to adjacent sinus disease.  CT CERVICAL SPINE 1. No acute fracture or traumatic listhesis of the cervical spine. 2. 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 Findings result in at most mild canal stenosis and mild-to-moderate foraminal narrowing.  CT CHEST, ABDOMEN AND PELVIS 1. Mild cutaneous thickening along the anterior left chest wall medial to the nipple, correlate for site of perforation. 2. Right radial diaphyseal fracture seen on scout imaging. 3. No other acute osseous injury of the chest, abdomen or pelvis. 4. Mild diffuse pancolonic mural thickening versus underdistention. Correlate for signs and symptoms of colitis. 5. Prostatomegaly. 6. Suspect right and possible left varicocele. 7. Small fat containing umbilical  and bilateral inguinal hernias. 8. Aortic Atherosclerosis (ICD10-I70.0).   1V PCXR 09/27/19: FINDINGS: There is no evidence of acute infiltrate, pleural effusion or pneumothorax. The heart size and mediastinal contours are within normal limits. The visualized skeletal structures are unremarkable. IMPRESSION: No active disease.   EKG: Scheduled for 10/02/19.   CV: None available at [facility] or [facility]. None seen in Care Everywhere.    Past Medical History:  Diagnosis Date  . Asthma    as a child  . Atrial fibrillation ([place])   . Dysrhythmia    Afib with RVR 01/21/2016  . Hematuria   . History of kidney stones    passed  . Lung collapse    partial collasped lung  . Pneumonia     Past Surgical History:  Procedure Laterality Date  . CHEST TUBE INSERTION    . COLONOSCOPY WITH PROPOFOL N/A 09/28/2016   Procedure: COLONOSCOPY WITH PROPOFOL;  Surgeon: Daneil Dolin, MD;  [facility];  Service: Endoscopy;  Laterality: N/A;  1215  . POLYPECTOMY  09/28/2016   Procedure: POLYPECTOMY;  Surgeon: Daneil Dolin, MD;  Location[facility];  Service: Endoscopy;;  sigmoid colon polyps times 3  cs and splenic flexure polyp    MEDICATIONS: No current facility-administered medications for this encounter.   Marland Kitchen acetaminophen (TYLENOL) 500  MG tablet  . oxyCODONE (ROXICODONE) 5 MG immediate release tablet    Myra Gianotti, PA-C Surgical Short Stay/Anesthesiology [facility] Phone [phone] [facility] Phone [phone] 10/01/2019 5:24 PM

## 2019-10-01 NOTE — Anesthesia Preprocedure Evaluation (Addendum)
Anesthesia Evaluation  Patient identified by MRN, date of birth, ID band Patient awake    Reviewed: Allergy & Precautions, NPO status , Patient's Chart, lab work & pertinent test results  Airway Mallampati: II  TM Distance: >3 FB Neck ROM: Full    Dental  (+) Missing, Dental Advisory Given,    Pulmonary neg shortness of breath, asthma , neg sleep apnea, neg recent URI, Current SmokerPatient did not abstain from smoking.,    breath sounds clear to auscultation       Cardiovascular (-) angina(-) Past MI and (-) CHF + dysrhythmias Atrial Fibrillation  Rhythm:Regular     Neuro/Psych negative neurological ROS  negative psych ROS   GI/Hepatic negative GI ROS, (+)     substance abuse  alcohol use,   Endo/Other  negative endocrine ROS  Renal/GU negative Renal ROS     Musculoskeletal Closed Galeazzi's fracture of left radius, initial encounter (W54.627O)   Abdominal   Peds  Hematology negative hematology ROS (+)   Anesthesia Other Findings   Reproductive/Obstetrics                            Anesthesia Physical Anesthesia Plan  ASA: II  Anesthesia Plan: MAC and Regional   Post-op Pain Management:    Induction:   PONV Risk Score and Plan: 0 and Treatment may vary due to age or medical condition  Airway Management Planned: Nasal Cannula  Additional Equipment:   Intra-op Plan:   Post-operative Plan:   Informed Consent: I have reviewed the patients History and Physical, chart, labs and discussed the procedure including the risks, benefits and alternatives for the proposed anesthesia with the patient or authorized representative who has indicated his/her understanding and acceptance.     Dental advisory given  Plan Discussed with: CRNA  Anesthesia Plan Comments: (See PAT note written 10/01/2019 by Myra Gianotti, PA-C.  This case will likely be a late start. He is suppose to get a  cardiology consult by [facility] on arrival, but likely cannot be evaluated until 8:00 AM or later which was communicated to Dr. Tama Headings staff. As of 10/01/19 5PM, there was no case following in his room.)      Anesthesia Quick Evaluation

## 2019-10-02 ENCOUNTER — Encounter (HOSPITAL_COMMUNITY): Payer: Self-pay | Admitting: Student

## 2019-10-02 ENCOUNTER — Ambulatory Visit (HOSPITAL_COMMUNITY)
Admission: RE | Admit: 2019-10-02 | Discharge: 2019-10-02 | Disposition: A | Discharge status: Home / Self Care | Payer: Managed Care, Other (non HMO) | Attending: Student | Admitting: Student

## 2019-10-02 ENCOUNTER — Ambulatory Visit (HOSPITAL_COMMUNITY): Payer: Managed Care, Other (non HMO)

## 2019-10-02 ENCOUNTER — Ambulatory Visit (HOSPITAL_COMMUNITY): Payer: Managed Care, Other (non HMO) | Admitting: Vascular Surgery

## 2019-10-02 ENCOUNTER — Other Ambulatory Visit: Payer: Self-pay

## 2019-10-02 ENCOUNTER — Encounter (HOSPITAL_COMMUNITY)
Admission: RE | Disposition: A | Discharge status: Home / Self Care | Payer: Self-pay | Source: Home / Self Care | Attending: Student

## 2019-10-02 DIAGNOSIS — S52371A Galeazzi's fracture of right radius, initial encounter for closed fracture: Secondary | ICD-10-CM | POA: Insufficient documentation

## 2019-10-02 DIAGNOSIS — W19XXXA Unspecified fall, initial encounter: Secondary | ICD-10-CM | POA: Insufficient documentation

## 2019-10-02 DIAGNOSIS — Y9389 Activity, other specified: Secondary | ICD-10-CM | POA: Diagnosis not present

## 2019-10-02 DIAGNOSIS — Z86010 Personal history of colonic polyps: Secondary | ICD-10-CM | POA: Insufficient documentation

## 2019-10-02 DIAGNOSIS — S63074A Dislocation of distal end of right ulna, initial encounter: Secondary | ICD-10-CM | POA: Insufficient documentation

## 2019-10-02 DIAGNOSIS — S52372A Galeazzi's fracture of left radius, initial encounter for closed fracture: Secondary | ICD-10-CM

## 2019-10-02 DIAGNOSIS — Z87442 Personal history of urinary calculi: Secondary | ICD-10-CM | POA: Diagnosis not present

## 2019-10-02 DIAGNOSIS — Z01810 Encounter for preprocedural cardiovascular examination: Secondary | ICD-10-CM | POA: Diagnosis not present

## 2019-10-02 DIAGNOSIS — S5291XA Unspecified fracture of right forearm, initial encounter for closed fracture: Secondary | ICD-10-CM

## 2019-10-02 DIAGNOSIS — I480 Paroxysmal atrial fibrillation: Secondary | ICD-10-CM

## 2019-10-02 DIAGNOSIS — F17210 Nicotine dependence, cigarettes, uncomplicated: Secondary | ICD-10-CM | POA: Insufficient documentation

## 2019-10-02 DIAGNOSIS — I700 Atherosclerosis of aorta: Secondary | ICD-10-CM | POA: Diagnosis not present

## 2019-10-02 DIAGNOSIS — K4020 Bilateral inguinal hernia, without obstruction or gangrene, not specified as recurrent: Secondary | ICD-10-CM | POA: Insufficient documentation

## 2019-10-02 DIAGNOSIS — M25331 Other instability, right wrist: Secondary | ICD-10-CM | POA: Diagnosis not present

## 2019-10-02 DIAGNOSIS — I4891 Unspecified atrial fibrillation: Secondary | ICD-10-CM | POA: Insufficient documentation

## 2019-10-02 DIAGNOSIS — Z419 Encounter for procedure for purposes other than remedying health state, unspecified: Secondary | ICD-10-CM

## 2019-10-02 DIAGNOSIS — N400 Enlarged prostate without lower urinary tract symptoms: Secondary | ICD-10-CM | POA: Insufficient documentation

## 2019-10-02 DIAGNOSIS — S52121A Displaced fracture of head of right radius, initial encounter for closed fracture: Secondary | ICD-10-CM | POA: Diagnosis present

## 2019-10-02 HISTORY — DX: Personal history of urinary calculi: Z87.442

## 2019-10-02 HISTORY — DX: Unspecified asthma, uncomplicated: J45.909

## 2019-10-02 HISTORY — PX: ORIF RADIAL FRACTURE: SHX5113

## 2019-10-02 SURGERY — OPEN REDUCTION INTERNAL FIXATION (ORIF) RADIAL FRACTURE
Anesthesia: Regional | Site: Arm Lower | Laterality: Right

## 2019-10-02 MED ORDER — PROPOFOL 1000 MG/100ML IV EMUL
INTRAVENOUS | Status: AC
  Filled 2019-10-02: qty 100

## 2019-10-02 MED ORDER — LIDOCAINE 2% (20 MG/ML) 5 ML SYRINGE
INTRAMUSCULAR | Status: AC
  Filled 2019-10-02: qty 5

## 2019-10-02 MED ORDER — LIDOCAINE-EPINEPHRINE (PF) 1.5 %-1:200000 IJ SOLN
INTRAMUSCULAR | Status: DC | PRN
Start: 2019-10-02 — End: 2019-10-02
  Administered 2019-10-02: 10 mL via PERINEURAL

## 2019-10-02 MED ORDER — PHENYLEPHRINE 40 MCG/ML (10ML) SYRINGE FOR IV PUSH (FOR BLOOD PRESSURE SUPPORT)
PREFILLED_SYRINGE | INTRAVENOUS | Status: DC | PRN
  Administered 2019-10-02: 40 ug via INTRAVENOUS
  Administered 2019-10-02 (×4): 80 ug via INTRAVENOUS

## 2019-10-02 MED ORDER — MIDAZOLAM HCL 2 MG/2ML IJ SOLN
INTRAMUSCULAR | Status: AC
  Filled 2019-10-02: qty 2

## 2019-10-02 MED ORDER — DEXMEDETOMIDINE HCL 200 MCG/2ML IV SOLN
INTRAVENOUS | Status: DC | PRN
  Administered 2019-10-02: 12 ug via INTRAVENOUS
  Administered 2019-10-02: 8 ug via INTRAVENOUS

## 2019-10-02 MED ORDER — CHLORHEXIDINE GLUCONATE 0.12 % MT SOLN: 15.0000 mL | Freq: Once | OROMUCOSAL | Status: AC

## 2019-10-02 MED ORDER — ONDANSETRON HCL 4 MG/2ML IJ SOLN
INTRAMUSCULAR | Status: AC
  Filled 2019-10-02: qty 2

## 2019-10-02 MED ORDER — OXYCODONE HCL 5 MG PO TABS
5.0000 mg | ORAL_TABLET | Freq: Once | ORAL | Status: AC
  Administered 2019-10-02: 5 mg via ORAL
  Filled 2019-10-02: qty 1

## 2019-10-02 MED ORDER — BUPIVACAINE HCL (PF) 0.5 % IJ SOLN
INTRAMUSCULAR | Status: DC | PRN
Start: 2019-10-02 — End: 2019-10-02
  Administered 2019-10-02: 30 mL via PERINEURAL

## 2019-10-02 MED ORDER — DEXAMETHASONE SODIUM PHOSPHATE 10 MG/ML IJ SOLN
INTRAMUSCULAR | Status: AC
  Filled 2019-10-02: qty 1

## 2019-10-02 MED ORDER — 0.9 % SODIUM CHLORIDE (POUR BTL) OPTIME
TOPICAL | Status: DC | PRN
  Administered 2019-10-02: 1000 mL

## 2019-10-02 MED ORDER — LACTATED RINGERS IV SOLN: INTRAVENOUS | Status: DC

## 2019-10-02 MED ORDER — ONDANSETRON HCL 4 MG/2ML IJ SOLN
INTRAMUSCULAR | Status: DC | PRN
  Administered 2019-10-02: 4 mg via INTRAVENOUS

## 2019-10-02 MED ORDER — PHENYLEPHRINE 40 MCG/ML (10ML) SYRINGE FOR IV PUSH (FOR BLOOD PRESSURE SUPPORT)
PREFILLED_SYRINGE | INTRAVENOUS | Status: AC
  Filled 2019-10-02: qty 10

## 2019-10-02 MED ORDER — FENTANYL CITRATE (PF) 100 MCG/2ML IJ SOLN
INTRAMUSCULAR | Status: AC
  Filled 2019-10-02: qty 2

## 2019-10-02 MED ORDER — FENTANYL CITRATE (PF) 250 MCG/5ML IJ SOLN
INTRAMUSCULAR | Status: AC
  Filled 2019-10-02: qty 5

## 2019-10-02 MED ORDER — METHOCARBAMOL 500 MG PO TABS: 500.0000 mg | ORAL_TABLET | Freq: Four times a day (QID) | ORAL | 0 refills | Status: DC | PRN

## 2019-10-02 MED ORDER — CEFAZOLIN SODIUM-DEXTROSE 2-4 GM/100ML-% IV SOLN
INTRAVENOUS | Status: AC
  Filled 2019-10-02: qty 100

## 2019-10-02 MED ORDER — FENTANYL CITRATE (PF) 100 MCG/2ML IJ SOLN
INTRAMUSCULAR | Status: DC | PRN
  Administered 2019-10-02: 25 ug via INTRAVENOUS

## 2019-10-02 MED ORDER — CHLORHEXIDINE GLUCONATE 0.12 % MT SOLN
OROMUCOSAL | Status: AC
  Administered 2019-10-02: 15 mL via OROMUCOSAL
  Filled 2019-10-02: qty 15

## 2019-10-02 MED ORDER — CEFAZOLIN SODIUM-DEXTROSE 2-4 GM/100ML-% IV SOLN
2.0000 g | INTRAVENOUS | Status: AC
  Administered 2019-10-02: 2 g via INTRAVENOUS

## 2019-10-02 MED ORDER — ORAL CARE MOUTH RINSE: 15.0000 mL | Freq: Once | OROMUCOSAL | Status: AC

## 2019-10-02 MED ORDER — MIDAZOLAM HCL 5 MG/5ML IJ SOLN
INTRAMUSCULAR | Status: DC | PRN
  Administered 2019-10-02: 2 mg via INTRAVENOUS

## 2019-10-02 MED ORDER — VANCOMYCIN HCL 1000 MG IV SOLR
INTRAVENOUS | Status: AC
  Filled 2019-10-02: qty 1000

## 2019-10-02 MED ORDER — PROPOFOL 500 MG/50ML IV EMUL
INTRAVENOUS | Status: DC | PRN
  Administered 2019-10-02: 100 ug/kg/min via INTRAVENOUS

## 2019-10-02 MED ORDER — VANCOMYCIN HCL 1000 MG IV SOLR
INTRAVENOUS | Status: DC | PRN
  Administered 2019-10-02: 1000 mg

## 2019-10-02 MED ORDER — PROPOFOL 10 MG/ML IV BOLUS
INTRAVENOUS | Status: AC
  Filled 2019-10-02: qty 20

## 2019-10-02 SURGICAL SUPPLY — 62 items
BIT DRILL 2.5X110 QC LCP DISP (BIT) ×2 IMPLANT
BLADE CLIPPER SURG (BLADE) IMPLANT
BNDG CMPR 9X4 STRL LF SNTH (GAUZE/BANDAGES/DRESSINGS) ×1
BNDG ELASTIC 3X5.8 VLCR STR LF (GAUZE/BANDAGES/DRESSINGS) ×3 IMPLANT
BNDG ELASTIC 4X5.8 VLCR STR LF (GAUZE/BANDAGES/DRESSINGS) ×3 IMPLANT
BNDG ESMARK 4X9 LF (GAUZE/BANDAGES/DRESSINGS) ×3 IMPLANT
BNDG GAUZE ELAST 4 BULKY (GAUZE/BANDAGES/DRESSINGS) ×3 IMPLANT
CLOSURE WOUND 1/2 X4 (GAUZE/BANDAGES/DRESSINGS) ×1
COVER SURGICAL LIGHT HANDLE (MISCELLANEOUS) ×3 IMPLANT
COVER WAND RF STERILE (DRAPES) ×1 IMPLANT
CUFF TOURN SGL QUICK 18X4 (TOURNIQUET CUFF) ×2 IMPLANT
CUFF TOURN SGL QUICK 24 (TOURNIQUET CUFF)
CUFF TRNQT CYL 24X4X16.5-23 (TOURNIQUET CUFF) IMPLANT
DECANTER SPIKE VIAL GLASS SM (MISCELLANEOUS) ×3 IMPLANT
DRAPE C-ARM 35X43 STRL (DRAPES) IMPLANT
DRAPE U-SHAPE 47X51 STRL (DRAPES) ×3 IMPLANT
DRSG MEPITEL 3X4 ME34 (GAUZE/BANDAGES/DRESSINGS) ×2 IMPLANT
GAUZE SPONGE 4X4 12PLY STRL (GAUZE/BANDAGES/DRESSINGS) ×3 IMPLANT
GAUZE SPONGE 4X4 12PLY STRL LF (GAUZE/BANDAGES/DRESSINGS) ×2 IMPLANT
GAUZE XEROFORM 1X8 LF (GAUZE/BANDAGES/DRESSINGS) ×3 IMPLANT
GLOVE BIO SURGEON STRL SZ 6.5 (GLOVE) ×2 IMPLANT
GLOVE BIO SURGEON STRL SZ7.5 (GLOVE) ×3 IMPLANT
GLOVE BIO SURGEONS STRL SZ 6.5 (GLOVE) ×1
GLOVE BIOGEL PI IND STRL 7.0 (GLOVE) ×1 IMPLANT
GLOVE BIOGEL PI IND STRL 7.5 (GLOVE) ×1 IMPLANT
GLOVE BIOGEL PI INDICATOR 7.0 (GLOVE) ×2
GLOVE BIOGEL PI INDICATOR 7.5 (GLOVE) ×2
GOWN STRL REUS W/ TWL LRG LVL3 (GOWN DISPOSABLE) ×1 IMPLANT
GOWN STRL REUS W/TWL LRG LVL3 (GOWN DISPOSABLE) ×3
K-WIRE 1.6X150 (WIRE) ×3
KIT BASIN OR (CUSTOM PROCEDURE TRAY) ×3 IMPLANT
KIT TURNOVER KIT B (KITS) ×3 IMPLANT
KWIRE 1.6X150 (WIRE) IMPLANT
MANIFOLD NEPTUNE II (INSTRUMENTS) ×3 IMPLANT
NEEDLE 22X1 1/2 (OR ONLY) (NEEDLE) IMPLANT
NS IRRIG 1000ML POUR BTL (IV SOLUTION) ×3 IMPLANT
PACK ORTHO EXTREMITY (CUSTOM PROCEDURE TRAY) ×3 IMPLANT
PAD ARMBOARD 7.5X6 YLW CONV (MISCELLANEOUS) ×6 IMPLANT
PAD CAST 3X4 CTTN HI CHSV (CAST SUPPLIES) IMPLANT
PAD CAST 4YDX4 CTTN HI CHSV (CAST SUPPLIES) ×1 IMPLANT
PADDING CAST COTTON 3X4 STRL (CAST SUPPLIES) ×3
PADDING CAST COTTON 4X4 STRL (CAST SUPPLIES) ×3
PLATE LCP 3.5 7H 98 (Plate) ×2 IMPLANT
SCREW CORTEX 3.5 16MM (Screw) ×6 IMPLANT
SCREW CORTEX 3.5 18MM (Screw) ×6 IMPLANT
SCREW LOCK CORT ST 3.5X16 (Screw) IMPLANT
SCREW LOCK CORT ST 3.5X18 (Screw) IMPLANT
SPLINT PLASTER EXTRA FAST 3X15 (CAST SUPPLIES) ×2
SPLINT PLASTER GYPS XFAST 3X15 (CAST SUPPLIES) IMPLANT
STRIP CLOSURE SKIN 1/2X4 (GAUZE/BANDAGES/DRESSINGS) ×1 IMPLANT
SUT MNCRL AB 3-0 PS2 27 (SUTURE) ×2 IMPLANT
SUT VIC AB 0 CT1 27 (SUTURE) ×6
SUT VIC AB 0 CT1 27XBRD ANBCTR (SUTURE) IMPLANT
SUT VIC AB 2-0 CT1 27 (SUTURE) ×6
SUT VIC AB 2-0 CT1 TAPERPNT 27 (SUTURE) IMPLANT
SYR CONTROL 10ML LL (SYRINGE) ×3 IMPLANT
TOWEL GREEN STERILE (TOWEL DISPOSABLE) ×3 IMPLANT
TOWEL GREEN STERILE FF (TOWEL DISPOSABLE) ×3 IMPLANT
TUBE CONNECTING 12'X1/4 (SUCTIONS) ×1
TUBE CONNECTING 12X1/4 (SUCTIONS) ×2 IMPLANT
WATER STERILE IRR 1000ML POUR (IV SOLUTION) ×3 IMPLANT
YANKAUER SUCT BULB TIP NO VENT (SUCTIONS) IMPLANT

## 2019-10-02 NOTE — Op Note (Signed)
Orthopaedic Surgery Operative Note (CSN: [id number] ) Date of Surgery: 10/02/2019  Admit Date: 10/02/2019   Diagnoses: Pre-Op Diagnoses: Right closed Galeazzi fracture/dislocation  Post-Op Diagnosis: Same  Procedures: CPT 25525-Open reduction internal fixation of right radial shaft with percutaneous fixation of right DRUJ  Surgeons : Primary: Shona Needles, MD  Assistant: Patrecia Pace, PA-C  Location: OR 6   Anesthesia:Regional with sedation  Antibiotics: Ancef 2g preop with 1 gm vancomycin powder   Tourniquet time:* Missing tourniquet times found for documented tourniquets in log: [id number] *  Estimated Blood LPFX:90 mL  Complications:None  Specimens:None   Implants: Implant Name Type Inv. Item Serial No. Manufacturer Lot No. LRB No. Used Action  PLATE LCP 3.5 7H 98 - [id number] Plate PLATE LCP 3.5 7H 98  [employer]  Right 1 Implanted  SCREW CORTEX 3.5 18MM - [id number] Screw SCREW CORTEX 3.5 18MM  [employer]  Right 3 Implanted  SCREW CORTEX 3.5 16MM - [id number] Screw SCREW CORTEX 3.5 16MM  [employer]  Right 3 Implanted     Indications for Surgery: 49 year old male who fell and sustained a right Galeazzi fracture dislocation.  He was placed in a sugar tong splint and referred to me as an outpatient.  Due to the displacement and unstable nature of his injury I recommended proceeding with open reduction internal fixation of right radius with possible percutaneous fixation of the DRUJ.  Discussed risks and benefits with the patient.  Risks included but not limited to bleeding, infection, malunion, nonunion, hardware failure, hardware irritation, need for percutaneous fixation of the DRUJ with limitation in range of motion, nerve and blood vessel injury, even possibility anesthetic complications.  He agreed to proceed with surgery and consent was obtained.  Operative Findings: 1.  Open reduction internal fixation of right radial shaft fracture using [employer]s 3.5 mm LCP  7-hole plate 2.  Dorsal dislocation and instability of DRUJ after fixation of the radius requiring closed reduction and percutaneous pinning of DRUJ in full supination  Procedure: The patient was identified in the preoperative holding area. Consent was confirmed with the patient and their family and all questions were answered. The operative extremity was marked after confirmation with the patient. he was then brought back to the operating room by our anesthesia colleagues.  He was carefully transferred over to a radiolucent flat top table.  He was placed under sedation.  His right upper extremity was prepped and draped in usual sterile fashion.  A timeout was performed to verify the patient, the procedure, and the extremity.  Preoperative antibiotics were dosed.  Fluoroscopic imaging showed the unstable nature of his injury.  His fracture was marked out in the incision was marked as well.  The tourniquet was inflated to 300 mmHg.  Total tourniquet time as noted above.  A standard volar Mallie Mussel approach was made through skin and subcutaneous tissue.  The interval between the radial artery and the brachial radialis was developed.  Identified the pronator teres and released the tendon off of the radial shaft.  I was able to expose the entirety of the radial shaft and fracture.  I cleaned out the hematoma and expose the fracture fragments.  There was a small ulnar butterfly that I left without fixation.  I then reduced the main proximal and distal fragments to an anatomic position.  I then contoured a 7 hole plate in place and on the volar aspect of the radius.  I fixed it to the proximal segment with a 3.5 mm nonlocking  screw.  I then reduced anatomically and placed a 3.5 millimeter screw in the distal segment.  I confirmed positioning and reduction with fluoroscopy and then proceeded to place another 2 screws proximal and distal to the fracture.  Fluoroscopic imaging was obtained to show the fracture reduction  in the fixation.  I then addressed the wrist.  On lateral view of the wrist he had significant dorsal dislocation of the DRUJ.  It was very unstable in neutral or pronation.  As result I felt that percutaneous fixation was most appropriate.  I placed the forearm in full supination and provided a volar force to the radial head.  I then placed 1.6 mm K wires from radial to ulnar providing bicortical fixation of both the radius and ulna.  I confirmed positioning with fluoroscopy.  The wires were placed on oscillation to prevent any damage the superficial radial nerve.  The tourniquet was deflated.  The incision was copiously irrigated.  The pronator teres was repaired back down to the radial shaft.  A gram of vancomycin powder was placed into the incision.  The skin was closed with 2-0 Vicryl and 3-0 Monocryl and Steri-Strips were placed.  The patient was then placed in a sugar tong splint in full supination.  He was then awoken from anesthesia and taken to the PACU in stable condition.  Post Op Plan/Instructions: Patient will be nonweightbearing to the right upper extremity.  He will return to see [place] in approximately 2 weeks for x-rays and wound check.  Likely remove his K wires at 3 to 4 weeks postoperatively.  No DVT prophylaxis is needed in this upper extremity patient.  He will discharge home from the PACU.  I was present and performed the entire surgery.  Patrecia Pace, PA-C did assist me throughout the case. An assistant was necessary given the difficulty in approach, maintenance of reduction and ability to instrument the fracture.   Katha Hamming, MD [facility]

## 2019-10-02 NOTE — Interval H&P Note (Signed)
History and Physical Interval Note:  10/02/2019 7:40 AM  Joe [name]  has presented today for surgery, with the diagnosis of RIGHT  Galeazzi fracture.  The various methods of treatment have been discussed with the patient and family. After consideration of risks, benefits and other options for treatment, the patient has consented to  Procedure(s): OPEN REDUCTION INTERNAL FIXATION (ORIF) RADIAL FRACTURE (Right) as a surgical intervention.  The patient's history has been reviewed, patient examined, no change in status, stable for surgery.  I have reviewed the patient's chart and labs.  Questions were answered to the patient's satisfaction.     Lennette Bihari P Arjun Hard

## 2019-10-02 NOTE — Progress Notes (Signed)
Card master called for update on cardiology consult.  Patient is on the consult list but she could not give me a time.

## 2019-10-02 NOTE — Progress Notes (Signed)
Cardiology paged regarding need for consult prior to patient going to OR.

## 2019-10-02 NOTE — Transfer of Care (Signed)
Immediate Anesthesia Transfer of Care Note  Patient: Joe [name]  Procedure(s) Performed: OPEN REDUCTION INTERNAL FIXATION (ORIF) RADIAL FRACTURE (Right Arm Lower)  Patient Location: PACU  Anesthesia Type:Regional  Level of Consciousness: awake  Airway & Oxygen Therapy: Patient Spontanous Breathing  Post-op Assessment: Report given to RN and Post -op Vital signs reviewed and stable  Post vital signs: Reviewed and stable  Last Vitals:  Vitals Value Taken Time  BP 114/87 10/02/19 1450  Temp    Pulse 63 10/02/19 1451  Resp 12 10/02/19 1451  SpO2 100 % 10/02/19 1451  Vitals shown include unvalidated device data.  Last Pain:  Vitals:   10/02/19 1450  TempSrc:   PainSc: (P) 0-No pain      Patients Stated Pain Goal: 3 (52/17/47 1595)  Complications: No complications documented.

## 2019-10-02 NOTE — Discharge Instructions (Addendum)
Orthopaedic Trauma Service Discharge Instructions   General Discharge Instructions  WEIGHT BEARING STATUS: Non-weightbearing right upper extremity  RANGE OF MOTION/ACTIVITY: Do no remove splint.  Wound Care: DO NOT remove splint. Okay to shower, must keep splint covered  DVT/PE prophylaxis: None  Diet: as you were eating previously.  Can use over the counter stool softeners and bowel preparations, such as Miralax, to help with bowel movements.  Narcotics can be constipating.  Be sure to drink plenty of fluids  PAIN MEDICATION USE AND EXPECTATIONS  You have likely been given narcotic medications to help control your pain.  After a traumatic event that results in an fracture (broken bone) with or without surgery, it is ok to use narcotic pain medications to help control one's pain.  We understand that everyone responds to pain differently and each individual patient will be evaluated on a regular basis for the continued need for narcotic medications. Ideally, narcotic medication use should last no more than 6-8 weeks (coinciding with fracture healing).   As a patient it is your responsibility as well to monitor narcotic medication use and report the amount and frequency you use these medications when you come to your office visit.   We would also advise that if you are using narcotic medications, you should take a dose prior to therapy to maximize you participation.  IF YOU ARE ON NARCOTIC MEDICATIONS IT IS NOT PERMISSIBLE TO OPERATE A MOTOR VEHICLE (MOTORCYCLE/CAR/TRUCK/MOPED) OR HEAVY MACHINERY DO NOT MIX NARCOTICS WITH OTHER CNS (CENTRAL NERVOUS SYSTEM) DEPRESSANTS SUCH AS ALCOHOL   STOP SMOKING OR USING NICOTINE PRODUCTS!!!!  As discussed nicotine severely impairs your body's ability to heal surgical and traumatic wounds but also impairs bone healing.  Wounds and bone heal by forming microscopic blood vessels (angiogenesis) and nicotine is a vasoconstrictor (essentially, shrinks blood  vessels).  Therefore, if vasoconstriction occurs to these microscopic blood vessels they essentially disappear and are unable to deliver necessary nutrients to the healing tissue.  This is one modifiable factor that you can do to dramatically increase your chances of healing your injury.    (This means no smoking, no nicotine gum, patches, etc)  DO NOT USE NONSTEROIDAL ANTI-INFLAMMATORY DRUGS (NSAID'S)  Using products such as Advil (ibuprofen), Aleve (naproxen), Motrin (ibuprofen) for additional pain control during fracture healing can delay and/or prevent the healing response.  If you would like to take over the counter (OTC) medication, Tylenol (acetaminophen) is ok.  However, some narcotic medications that are given for pain control contain acetaminophen as well. Therefore, you should not exceed more than 4000 mg of tylenol in a day if you do not have liver disease.  Also note that there are may OTC medicines, such as cold medicines and allergy medicines that my contain tylenol as well.  If you have any questions about medications and/or interactions please ask your doctor/PA or your pharmacist.      ICE AND ELEVATE INJURED/OPERATIVE EXTREMITY  Using ice and elevating the injured extremity above your heart can help with swelling and pain control.  Icing in a pulsatile fashion, such as 20 minutes on and 20 minutes off, can be followed.    Do not place ice directly on skin. Make sure there is a barrier between to skin and the ice pack.    Using frozen items such as frozen peas works well as the conform nicely to the are that needs to be iced.  USE AN ACE WRAP OR TED HOSE FOR SWELLING CONTROL  In  addition to icing and elevation, Ace wraps or TED hose are used to help limit and resolve swelling.  It is recommended to use Ace wraps or TED hose until you are informed to stop.    When using Ace Wraps start the wrapping distally (farthest away from the body) and wrap proximally (closer to the  body)   Example: If you had surgery on your leg or thing and you do not have a splint on, start the ace wrap at the toes and work your way up to the thigh        If you had surgery on your upper extremity and do not have a splint on, start the ace wrap at your fingers and work your way up to the upper arm  IF YOU ARE IN A SPLINT OR CAST DO NOT [place]   If your splint gets wet for any reason please contact the office immediately. You may shower in your splint or cast as long as you keep it dry.  This can be done by wrapping in a cast cover or garbage back (or similar)  Do Not stick any thing down your splint or cast such as pencils, money, or hangers to try and scratch yourself with.  If you feel itchy take benadryl as prescribed on the bottle for itching    CALL THE OFFICE WITH ANY QUESTIONS OR CONCERNS: [phone]   VISIT OUR WEBSITE FOR ADDITIONAL INFORMATION: [email or web address]      Discharge Pin Site Instructions  Dress pins daily with Kerlix roll starting on POD 2. Wrap the Kerlix so that it tamps the skin down around the pin-skin interface to prevent/limit motion of the skin relative to the pin.  (Pin-skin motion is the primary cause of pain and infection related to external fixator pin sites).  Remove any crust or coagulum that may obstruct drainage with a saline moistened gauze or soap and water.  After POD 3, if there is no discernable drainage on the pin site dressing, the interval for change can by increased to every other day.  You may shower with the fixator, cleaning all pin sites gently with soap and water.  If you have a surgical wound this needs to be completely dry and without drainage before showering.  The extremity can be lifted by the fixator to facilitate wound care and transfers.  Notify the office/Doctor if you experience increasing drainage, redness, or pain from a pin site, or if you notice purulent (thick, snot-like) drainage.  Discharge  Wound Care Instructions  Do NOT apply any ointments, solutions or lotions to pin sites or surgical wounds.  These prevent needed drainage and even though solutions like hydrogen peroxide kill bacteria, they also damage cells lining the pin sites that help fight infection.  Applying lotions or ointments can keep the wounds moist and can cause them to breakdown and open up as well. This can increase the risk for infection. When in doubt call the office.  Surgical incisions should be dressed daily.  If any drainage is noted, use one layer of adaptic, then gauze, Kerlix, and an ace wrap.  Once the incision is completely dry and without drainage, it may be left open to air out.  Showering may begin 36-48 hours later.  Cleaning gently with soap and water.  Traumatic wounds should be dressed daily as well.    One layer of adaptic, gauze, Kerlix, then ace wrap.  The adaptic can be discontinued once the draining has  ceased    If you have a wet to dry dressing: wet the gauze with saline the squeeze as much saline out so the gauze is moist (not soaking wet), place moistened gauze over wound, then place a dry gauze over the moist one, followed by Kerlix wrap, then ace wrap.

## 2019-10-02 NOTE — Anesthesia Procedure Notes (Signed)
Anesthesia Regional Block: Axillary brachial plexus block   Pre-Anesthetic Checklist: ,, timeout performed, Correct Patient, Correct Site, Correct Laterality, Correct Procedure, Correct Position, site marked, Risks and benefits discussed,  Surgical consent,  Pre-op evaluation,  At surgeon's request and post-op pain management  Laterality: Right and Upper  Prep: chloraprep       Needles:  Injection technique: Single-shot     Needle Length: 9cm  Needle Gauge: 22     Additional Needles: Arrow StimuQuik ECHO Echogenic Stimulating PNB Needle  Procedures:,,,, ultrasound used (permanent image in chart),,,,  Narrative:  Start time: 10/02/2019 12:35 PM End time: 10/02/2019 12:44 PM Injection made incrementally with aspirations every 5 mL.  Performed by: Personally  Anesthesiologist: Oleta Mouse, MD

## 2019-10-02 NOTE — Consult Note (Addendum)
Cardiology Consultation:   Patient ID: Joe [name]; [id number]; September 13, 1970   Admit date: 10/02/2019 Date of Consult: 10/02/2019  Primary Care Provider: Jani Gravel, MD Primary Cardiologist: New to [facility]   Patient Profile:   Joe [name] is a 49 y.o. male with a remote hx of atrial fibrillation not on AC due to CHA2DS2VASc 0 and tobacco use who is being seen today for surgical clearance for ORIF of radial fracture at the request of Dr. Doreatha [name].  History of Present Illness:   Joe [name] is a 49 year old male with a history stated above who initially presented to [facility] emergency department on 09/27/2019 after sustaining a fall secondary to hover board accident. Imaging at that time showed a left radial shaft fracture. Orthopedics was consulted who recommended fracture reduction by ED physician with placement of a splint. Patient was discharged with close follow-up with orthopedics in the outpatient setting to discuss surgical management.  He was seen by OTS clinic 09/30/2019 with plan for surgical repair today.   He was last seen by our service during hospital consultation 01/2016 for the evaluation of atrial fibrillation felt to be in the setting of regular EtOH usage. Rates were controlled on IV diltiazem which was  transitioned to p.o. dosing. Metoprolol 50 mg was added to his regimen with instructions to take an extra 50 mg as needed for palpitations. CHA2DS2-VASc score was 0 therefore no long-term anticoagulation was needed. ASA 81 was added to his regimen.  He has not been seen by our service since that time.  On my exam today, he is in significant pain given the above. He reports that he is very physical at baseline with an active job and has no reports of chest pain or other anginal symptoms. He is not fluid volume overloaded on exam and reports no episodes of recurrent palpitations. He denies SOB, LE edema or orthopnea. He is not currently taking any AV nodal blocking agents  for AF.   Past Medical History:  Diagnosis Date  . Asthma    as a child  . Atrial fibrillation ([place])   . Dysrhythmia    Afib with RVR 01/21/2016  . Hematuria   . History of kidney stones    passed  . Lung collapse    partial collasped lung  . Pneumonia     Past Surgical History:  Procedure Laterality Date  . CHEST TUBE INSERTION    . COLONOSCOPY WITH PROPOFOL N/A 09/28/2016   Procedure: COLONOSCOPY WITH PROPOFOL;  Surgeon: Joe Dolin, MD;  Location: [facility];  Service: Endoscopy;  Laterality: N/A;  1215  . POLYPECTOMY  09/28/2016   Procedure: POLYPECTOMY;  Surgeon: Joe Dolin, MD;  Location: [facility];  Service: Endoscopy;;  sigmoid colon polyps times 3  cs and splenic flexure polyp     Prior to Admission medications   Medication Sig Start Date End Date Taking? Authorizing Provider  acetaminophen (TYLENOL) 500 MG tablet Take 500 mg by mouth every 6 (six) hours as needed.   Yes [provider]  oxyCODONE (ROXICODONE) 5 MG immediate release tablet Take 1 tablet (5 mg total) by mouth every 4 (four) hours as needed for severe pain. 09/28/19  Yes Joe Flakes, MD    Inpatient Medications: Scheduled Meds:  Continuous Infusions: . ceFAZolin    .  ceFAZolin (ANCEF) IV    . lactated ringers     PRN Meds:   Allergies:   No Known Allergies  Social  History:   Social History   Socioeconomic History  . Marital status: Single    Spouse name: Not on file  . Number of children: 2  . Years of education: Not on file  . Highest education level: Not on file  Occupational History  . Occupation: unemployed  Tobacco Use  . Smoking status: Current Every Day Smoker    Packs/day: 1.00    Types: Cigarettes  . Smokeless tobacco: Never Used  Vaping Use  . Vaping Use: Never used  Substance and Sexual Activity  . Alcohol use: Yes    Comment: socially, on weekends a pint  . Drug use: No  . Sexual activity: Yes  Other Topics Concern  . Not on file    Social History Narrative  . Not on file   Social Determinants of Health   Financial Resource Strain:   . Difficulty of Paying Living Expenses:   Food Insecurity:   . Worried About Charity fundraiser in the Last Year:   . Arboriculturist in the Last Year:   Transportation Needs:   . Film/video editor (Medical):   Marland Kitchen Lack of Transportation (Non-Medical):   Physical Activity:   . Days of Exercise per Week:   . Minutes of Exercise per Session:   Stress:   . Feeling of Stress :   Social Connections:   . Frequency of Communication with Friends and Family:   . Frequency of Social Gatherings with Friends and Family:   . Attends Religious Services:   . Active Member of Clubs or Organizations:   . Attends Archivist Meetings:   Marland Kitchen Marital Status:   Intimate Partner Violence:   . Fear of Current or Ex-Partner:   . Emotionally Abused:   Marland Kitchen Physically Abused:   . Sexually Abused:     Family History:   Family History  Problem Relation Age of Onset  . Colon cancer Neg Hx   . Liver disease Neg Hx    Family Status:  Family Status  Relation Name Status  . Neg Hx  (Not Specified)    ROS:  Please see the history of present illness.  All other ROS reviewed and negative.     Physical Exam/Data:   Vitals:   10/02/19 0635  BP: (!) 134/94  Pulse: 73  Resp: 17  Temp: 98.6 F (37 C)  TempSrc: Oral  SpO2: 100%  Weight: 105.7 kg  Height: 6\' 1"  (1.854 m)   No intake or output data in the 24 hours ending 10/02/19 1107 Filed Weights   10/02/19 0635  Weight: 105.7 kg   Body mass index is 30.74 kg/m.   General: Well developed, well nourished, NAD Neck: Negative for carotid bruits. No JVD Lungs:Clear to ausculation bilaterally. No wheezes, rales, or rhonchi. Breathing is unlabored. Cardiovascular: RRR with S1 S2. No murmurs Abdomen: Soft, non-tender, non-distended. No obvious abdominal masses. Extremities: No edema. Radial pulses 2+ bilaterally Neuro: Alert and  oriented. No focal deficits. No facial asymmetry. MAE spontaneously. Psych: Responds to questions appropriately with normal affect.    EKG:  The EKG was personally reviewed and demonstrates: 10/02/19 NSR with HR 71bpm and no acute changes. Non-specific TWI in lead III, similar to prior tracing from 2018 Telemetry:  Telemetry was personally reviewed and demonstrates: 10/02/19 NSR  Relevant CV Studies:  None   Laboratory Data:  Chemistry Recent Labs  Lab 09/27/19 2158  NA 139  K 3.8  CL 106  CO2 22  GLUCOSE  118*  BUN 17  CREATININE 0.95  CALCIUM 8.7*  GFRNONAA >60  GFRAA >60  ANIONGAP 11    Total Protein  Date Value Ref Range Status  09/27/2019 7.2 6.5 - 8.1 g/dL Final  10/16/2012 7.2 6.4 - 8.2 g/dL Final   Albumin  Date Value Ref Range Status  09/27/2019 3.9 3.5 - 5.0 g/dL Final  10/16/2012 3.8 3.4 - 5.0 g/dL Final   AST  Date Value Ref Range Status  09/27/2019 43 (H) 15 - 41 U/L Final   SGOT(AST)  Date Value Ref Range Status  10/16/2012 26 15 - 37 Unit/L Final   ALT  Date Value Ref Range Status  09/27/2019 34 0 - 44 U/L Final   SGPT (ALT)  Date Value Ref Range Status  10/16/2012 24 12 - 78 U/L Final   Alkaline Phosphatase  Date Value Ref Range Status  09/27/2019 52 38 - 126 U/L Final  10/16/2012 76 50 - 136 Unit/L Final   Total Bilirubin  Date Value Ref Range Status  09/27/2019 0.5 0.3 - 1.2 mg/dL Final   Bilirubin,Total  Date Value Ref Range Status  10/16/2012 0.2 0.2 - 1.0 mg/dL Final   Hematology Recent Labs  Lab 09/27/19 2158  WBC 13.5*  RBC 4.69  HGB 13.5  HCT 41.6  MCV 88.7  MCH 28.8  MCHC 32.5  RDW 14.8  PLT 292   Cardiac EnzymesNo results for input(s): TROPONINI in the last 168 hours. No results for input(s): TROPIPOC in the last 168 hours.  BNPNo results for input(s): BNP, PROBNP in the last 168 hours.  DDimer No results for input(s): DDIMER in the last 168 hours. TSH:  Lab Results  Component Value Date   TSH 0.986  01/21/2016   Lipids:No results found for: CHOL, HDL, LDLCALC, LDLDIRECT, TRIG, CHOLHDL HgbA1c:No results found for: HGBA1C  Radiology/Studies:  No results found.  Assessment and Plan:   1. Remote hx of atrial fibrillation: -Pt initially seen by our service 01/2016 after presenting to the emergency department with palpitations found to be in atrial fibrillation with RVR. He was treated with IV diltiazem with improved rates and was therefore transition to diltiazem 60 mg p.o. -Plan was for Lopressor 50 mg p.o. twice daily with instructions to take an extra 50 mg as needed for palpitations -He was counseled on cutting back or stopping EtOH as this was felt to be contributing to atrial fibrillation -CHA2DS2VASc = 0 therefore no long-term anticoagulation was initiated -He was started on ASA 81 mg p.o. daily -No reports of recurrent symptoms  -No longer on AV nodal blocking agents at this time   2. Surgical clearance: -Pt presented to [facility] emergency department on 09/27/2019 after sustaining a fall secondary to hover board accident. Imaging at that time showed a left radial shaft fracture. Orthopedics was consulted who recommended fracture reduction by ED physician with placement of a splint. Patient was discharged with close follow-up with orthopedics in the outpatient setting to discuss surgical management.  He was seen by OTS clinic 09/30/2019 with plan for surgical repair today.  -The patient does not have any unstable cardiac conditions. Upon evaluation today, he can achieve 4 METs or greater without anginal symptoms. He reports no recurrent episodes of AF. He has no s/s of fluid volume overload on exam. According to [facility] and AHA guidelines, he requires no further cardiac workup prior to his noncardiac surgery and should be at acceptable risk. His Revised cardiac risk of peri-procedural MI or cardiac arrest  following surgery is low.    For questions or updates, please contact [place] Please consult [email or web address] for contact info under Cardiology/STEMI.   Lyndel Safe NP-C [facility] Pager: [phone] 10/02/2019 11:07 AM   I have personally seen and examined this patient. I agree with the assessment and plan as outlined above.  Pt has remote atrial fib. No issues over the past 4 years. Cardiac exam is normal. EKG normal.  Proceed with planned surgical procedure.   Lauree Chandler 10/02/2019 12:26 PM

## 2019-10-03 ENCOUNTER — Encounter (HOSPITAL_COMMUNITY): Payer: Self-pay | Admitting: Student

## 2019-10-03 NOTE — Anesthesia Postprocedure Evaluation (Signed)
Anesthesia Post Note  Patient: Joe [name]  Procedure(s) Performed: OPEN REDUCTION INTERNAL FIXATION (ORIF) RADIAL FRACTURE (Right Arm Lower)     Patient location during evaluation: PACU Anesthesia Type: Regional and MAC Level of consciousness: awake and alert Pain management: pain level controlled Vital Signs Assessment: post-procedure vital signs reviewed and stable Respiratory status: spontaneous breathing, nonlabored ventilation, respiratory function stable and patient connected to nasal cannula oxygen Cardiovascular status: stable and blood pressure returned to baseline Postop Assessment: no apparent nausea or vomiting Anesthetic complications: no   No complications documented.  Last Vitals:  Vitals:   10/02/19 1505 10/02/19 1517  BP: 128/88 118/87  Pulse: (!) 58 (!) 55  Resp: 13 11  Temp:  36.4 C  SpO2: 100% 100%    Last Pain:  Vitals:   10/02/19 1517  TempSrc:   PainSc: 0-No pain                 Sahir Tolson

## 2020-03-19 ENCOUNTER — Ambulatory Visit (HOSPITAL_COMMUNITY): Payer: Managed Care, Other (non HMO) | Attending: Student | Admitting: Occupational Therapy

## 2020-03-19 ENCOUNTER — Encounter (HOSPITAL_COMMUNITY): Payer: Self-pay | Admitting: Occupational Therapy

## 2020-03-19 ENCOUNTER — Other Ambulatory Visit: Payer: Self-pay

## 2020-03-19 DIAGNOSIS — M25531 Pain in right wrist: Secondary | ICD-10-CM | POA: Insufficient documentation

## 2020-03-19 DIAGNOSIS — M25631 Stiffness of right wrist, not elsewhere classified: Secondary | ICD-10-CM | POA: Insufficient documentation

## 2020-03-19 DIAGNOSIS — R29898 Other symptoms and signs involving the musculoskeletal system: Secondary | ICD-10-CM | POA: Insufficient documentation

## 2020-03-19 NOTE — Therapy (Signed)
[place], [place], [address] Phone: [phone]   Fax:  [phone]  Occupational Therapy Evaluation  Patient Details  Name: Joe [name] MRN: [id number] Date of Birth: 07-Jun-1970 Referring Provider (OT): Joe [name], [place] Joe [name])   Encounter Date: 03/19/2020   OT End of Session - 03/19/20 1123    Visit Number 1    Number of Visits 4    Date for OT Re-Evaluation 04/18/20    Authorization Type cigna Managed    Authorization Time Period 30 visit limit, 19 used as of evaluation    Authorization - Visit Number 20    Authorization - Number of Visits 30    OT Start Time [phone]    OT Stop Time 1024    OT Time Calculation (min) 38 min    Activity Tolerance Patient tolerated treatment well    Behavior During Therapy [facility] for tasks assessed/performed           Past Medical History:  Diagnosis Date  . Asthma    as a child  . Atrial fibrillation ([place])   . Dysrhythmia    Afib with RVR 01/21/2016  . Hematuria   . History of kidney stones    passed  . Lung collapse    partial collasped lung  . Pneumonia     Past Surgical History:  Procedure Laterality Date  . CHEST TUBE INSERTION    . COLONOSCOPY WITH PROPOFOL N/A 09/28/2016   Procedure: COLONOSCOPY WITH PROPOFOL;  Surgeon: Daneil Dolin, MD;  [facility];  Service: Endoscopy;  Laterality: N/A;  1215  . ORIF RADIAL FRACTURE Right 10/02/2019   Procedure: OPEN REDUCTION INTERNAL FIXATION (ORIF) RADIAL FRACTURE;  Surgeon: Shona Needles, MD;  Location: [facility];  Service: Orthopedics;  Laterality: Right;  . POLYPECTOMY  09/28/2016   Procedure: POLYPECTOMY;  Surgeon: Daneil Dolin, MD;  [facility];  Service: Endoscopy;;  sigmoid colon polyps times 3  cs and splenic flexure polyp    There were no vitals filed for this visit.   Subjective Assessment - 03/19/20 1118    Subjective  S: I have a 10# lifting restriction.     Pertinent History Pt is a 49 y/o male s/p open reduction internal fixation of right radial shaft with percutaneous fixation of right DRUJ on 10/02/19 by Dr. Lennette Bihari Haddix. Pt received therapy at the hand specialist in [place] until before Christmas and report continued pain and stiffness of his RUE. Pt asked to transfer to [facility] because it is closer to his home. Pt was referred to occupational therapy for evaluation and treatment by Joe Pace, PA.    Special Tests DASH: 38.64%   Patient Stated Goals To have less pain in my right arm and be able to do more with it.    Currently in Pain? Yes    Pain Score 3     Pain Location Wrist    Pain Orientation Right    Pain Descriptors / Indicators Sore    Pain Type Chronic pain    Pain Radiating Towards N/A    Pain Onset More than a month ago    Pain Frequency Intermittent    Aggravating Factors  increased use    Pain Relieving Factors rest, pain medication    Effect of Pain on Daily Activities min effect on ADLs    Multiple Pain [name] No  [facility] OT Assessment - 03/19/20 0945      Assessment   Medical Diagnosis s/p right galeazzi fx    Referring Provider (OT) Joe Pace, PA   Joe [name]   Onset Date/Surgical Date 10/02/19    Hand Dominance Left    Next MD Visit 03/24/20    Prior Therapy Hand specialist in [place] x4 months; finished in 12/21      Precautions   Precautions Other (comment)    Precaution Comments 10# lifting restriction      Restrictions   Weight Bearing Restrictions No      Balance Screen   Has the patient fallen in the past 6 months No      Prior Function   Level of Independence Independent    Vocation Full time employment    Vocation Requirements drives forklifts, heavy lifting    Leisure playing basketball      ADL   ADL comments Pt is having difficulty with lifting tasks, turning doorknobs, sustained use of right hand/arm      Written Expression   Dominant Hand Left      Cognition   Overall  Cognitive Status Within Functional Limits for tasks assessed      Observation/Other Assessments   Quick DASH  38.64      ROM / Strength   AROM / PROM / Strength AROM;Strength      Palpation   Palpation comment one moderate muscle knot at distal end of incision scar. Min fascial restrictions along volar forearm      AROM   AROM Assessment Site Wrist;Forearm    Right/Left Forearm Right    Right Forearm Pronation 82 Degrees    Right Forearm Supination 90 Degrees    Right/Left Wrist Right    Right Wrist Extension 58 Degrees    Right Wrist Flexion 56 Degrees    Right Wrist Radial Deviation 30 Degrees    Right Wrist Ulnar Deviation 50 Degrees      Strength   Strength Assessment Site Forearm;Wrist;Hand    Right/Left Forearm Right    Right Forearm Pronation 5/5    Right Forearm Supination 5/5    Right/Left Wrist Right    Right Wrist Flexion 5/5    Right Wrist Extension 5/5    Right Wrist Radial Deviation 5/5    Right Wrist Ulnar Deviation 5/5    Right/Left hand Right;Left    Right Hand Gross Grasp Functional    Right Hand Grip (lbs) 80    Right Hand Lateral Pinch 19 lbs    Right Hand 3 Point Pinch 19 lbs    Left Hand Gross Grasp Functional    Left Hand Grip (lbs) 130    Left Hand Lateral Pinch 24 lbs    Left Hand 3 Point Pinch 22 lbs               Katina Dung - 03/19/20 0954    Open a tight or new jar Moderate difficulty    Do heavy household chores (wash walls, wash floors) Moderate difficulty    Carry a shopping bag or briefcase No difficulty    Wash your back Severe difficulty    Use a knife to cut food No difficulty    Recreational activities in which you take some force or impact through your arm, shoulder, or hand (golf, hammering, tennis) Severe difficulty    During the past week, to what extent has your arm, shoulder or hand problem interfered with your normal social activities with family,  friends, neighbors, or groups? Not at all    During the past week, to  what extent has your arm, shoulder or hand problem limited your work or other regular daily activities Quite a bit    Arm, shoulder, or hand pain. Moderate    Tingling (pins and needles) in your arm, shoulder, or hand None    Difficulty Sleeping Moderate difficulty    DASH Score 38.64 %               OT Treatments/Exercises (OP) - 03/19/20 1123      Modalities   Modalities Ultrasound      Ultrasound   Ultrasound Location volar forearm along scar    Ultrasound Parameters 1.5 W/cm2    Ultrasound Goals Pain                 OT Education - 03/19/20 1122    Education Details educated pt on expectation of joint stiffness after a break and surgery and on expectations of therapy progress at this point, 5 months out of sx.    Person(s) Educated Patient    Methods Explanation    Comprehension Verbalized understanding            OT Short Term Goals - 03/19/20 1136      OT SHORT TERM GOAL #1   Title Pt will be provided with and educated on HEP to improve use of RUE as non-dominant during ADLs.    Time 4    Period Weeks    Status New    Target Date 04/18/20      OT SHORT TERM GOAL #2   Title Pt will decrease pain in RUE to 3/10 or less to improve ability to sleep at night for 4+ consecutive hours without waking due to pain.    Time 4    Period Weeks    Status New      OT SHORT TERM GOAL #3   Title Pt will increase right grip strength by 15# and pinch by 3# to improve ability to maintain grasp on items during functional use.    Time 4    Period Weeks    Status New      OT SHORT TERM GOAL #4   Title Pt will report increased activity tolerance when using RUE to improve ability to perform work tasks.    Time 4    Period Weeks    Status New                    Plan - 03/19/20 1124    Clinical Impression Statement A: Pt is a 49 y/o male s/p open reduction internal fixation of right radial shaft with percutaneous fixation of right DRUJ on 10/02/19. Pt has  had PT at [facility] and [facility] and is transferring care for location purposes. Pt demonstrates ROM WFL, wrist strength WNL, decreased grip and pinch strength however continues to be functional, and decreased activity tolerance. Pt is on 10# lifting restrictions and goes back to the MD next week for follow up appt. Pt reports continues pain and stiffness limiting functional use of RUE during ADLs, discussed expectations for changes in joint mobility and functional use after a break and surgery. Pt verbalized understanding.    OT Occupational Profile and History Problem Focused Assessment - Including review of records relating to presenting problem    Occupational performance deficits (Please refer to evaluation for details): ADL's;IADL's;Rest and Sleep;Work;Leisure    Body Structure /  Function / Physical Skills ADL;Endurance;UE functional use;Fascial restriction;Pain;ROM;IADL;Strength    Rehab Potential Good    Clinical Decision Making Limited treatment options, no task modification necessary    Comorbidities Affecting Occupational Performance: None    Modification or Assistance to Complete Evaluation  No modification of tasks or assist necessary to complete eval    OT Frequency 1x / week    OT Duration 4 weeks    OT Treatment/Interventions Self-care/ADL training;Ultrasound;Patient/family education;Scar mobilization;Paraffin;Passive range of motion;Cryotherapy;Electrical Stimulation;Moist Heat;Therapeutic exercise;Manual Therapy;Therapeutic activities    Plan P: Pt will benefit from skilled OT services to increase grip and pinch strength, increase activity tolerance, and decrease pain in RUE. Treatment plan: myofascial release and manual techniques, modalities prn, grip and pinch strengthening, sustained grip and wrist strength tasks for activity tolerance    Consulted and Agree with Plan of Care Patient           Patient will benefit from skilled therapeutic intervention in order to improve  the following deficits and impairments:   Body Structure / Function / Physical Skills: ADL,Endurance,UE functional use,Fascial restriction,Pain,ROM,IADL,Strength       Visit Diagnosis: Pain in right wrist  Other symptoms and signs involving the musculoskeletal system  Stiffness of right wrist, not elsewhere classified    Problem List Patient Active Problem List   Diagnosis Date Noted  . Pre-operative cardiovascular examination   . PAF (paroxysmal atrial fibrillation) ([facility])   . Closed Galeazzi's fracture of left radius 09/30/2019  . Rectal bleeding 08/12/2016   Joe [name], Joe [name]  [phone] 03/19/2020, 11:40 AM  [facility] [address] [place], [place], [address] Phone: [phone]   Fax:  [phone]  Name: Joe [name] MRN: [id number] Date of Birth: 11-09-1970

## 2020-03-27 ENCOUNTER — Other Ambulatory Visit: Payer: Self-pay

## 2020-03-27 ENCOUNTER — Ambulatory Visit (HOSPITAL_COMMUNITY): Payer: Managed Care, Other (non HMO) | Attending: Student

## 2020-03-27 ENCOUNTER — Encounter (HOSPITAL_COMMUNITY): Payer: Self-pay

## 2020-03-27 DIAGNOSIS — M25631 Stiffness of right wrist, not elsewhere classified: Secondary | ICD-10-CM | POA: Insufficient documentation

## 2020-03-27 DIAGNOSIS — R29898 Other symptoms and signs involving the musculoskeletal system: Secondary | ICD-10-CM | POA: Diagnosis present

## 2020-03-27 DIAGNOSIS — M25531 Pain in right wrist: Secondary | ICD-10-CM | POA: Diagnosis present

## 2020-03-27 NOTE — Therapy (Signed)
[facility] [address] [place], [place], [address] Phone: [phone]   Fax:  [phone]  Occupational Therapy Treatment  Patient Details  Name: Joe [name] MRN: [id number] Date of Birth: 1971-01-02 Referring Provider (OT): Patrecia Pace, [place] Lennette Bihari Haddix-surgeon)   Encounter Date: 03/27/2020   OT End of Session - 03/27/20 1010    Visit Number 2    Number of Visits 4    Date for OT Re-Evaluation 04/18/20    Authorization Type cigna Managed    Authorization Time Period 30 visit limit. Visit limit started over at start of calendar year.    Authorization - Visit Number 1    Authorization - Number of Visits 30    OT Start Time [phone]    OT Stop Time 1030    OT Time Calculation (min) 40 min    Activity Tolerance Patient tolerated treatment well    Behavior During Therapy WFL for tasks assessed/performed           Past Medical History:  Diagnosis Date  . Asthma    as a child  . Atrial fibrillation ([place])   . Dysrhythmia    Afib with RVR 01/21/2016  . Hematuria   . History of kidney stones    passed  . Lung collapse    partial collasped lung  . Pneumonia     Past Surgical History:  Procedure Laterality Date  . CHEST TUBE INSERTION    . COLONOSCOPY WITH PROPOFOL N/A 09/28/2016   Procedure: COLONOSCOPY WITH PROPOFOL;  Surgeon: Daneil Dolin, MD;  [facility];  Service: Endoscopy;  Laterality: N/A;  1215  . ORIF RADIAL FRACTURE Right 10/02/2019   Procedure: OPEN REDUCTION INTERNAL FIXATION (ORIF) RADIAL FRACTURE;  Surgeon: Shona Needles, MD;  Location: [facility];  Service: Orthopedics;  Laterality: Right;  . POLYPECTOMY  09/28/2016   Procedure: POLYPECTOMY;  Surgeon: Daneil Dolin, MD;  [facility];  Service: Endoscopy;;  sigmoid colon polyps times 3  cs and splenic flexure polyp    There were no vitals filed for this visit.   Subjective Assessment - 03/27/20 1003    Subjective  S: It feels good  this morning. I haven't been doing anything with it.    Currently in Pain? No/denies              [facility] OT Assessment - 03/27/20 1004      Assessment   Medical Diagnosis s/p right galeazzi fx      Precautions   Precautions None    Precaution Comments New MD order: No lifting restrictions. Progress as tolerated. Plan to return to full duty at work in 6 weeks.                    OT Treatments/Exercises (OP) - 03/27/20 1005      Exercises   Exercises Hand;Theraputty      Hand Exercises   Hand Gripper with Large Beads all beads with gripper set at 69#   horizontal   Hand Gripper with Medium Beads all beads with gripper set at 69#   horizontal   Hand Gripper with Small Beads all beads with gripper set at 69#   horizontal   Other Hand Exercises Utilized PVC pipe in right hand to push circles into flattened green putty focusing on grip strength and forearm strength.      Theraputty   Theraputty - Flatten green    Theraputty - Roll  green    Theraputty - Grip green -pronated      Modalities   Modalities Moist Heat      Moist Heat Therapy   Number Minutes Moist Heat 5 Minutes    Moist Heat Location Hand;Wrist      Manual Therapy   Manual Therapy Myofascial release    Manual therapy comments Manual therapy completed prior to exercises.    Myofascial Release Myofascial release completed to right forearm along healed incision to assess fascial restrictions and scar tissue.                  OT Education - 03/27/20 1010    Education Details Reviewed therapy goals    Person(s) Educated Patient    Methods Explanation    Comprehension Verbalized understanding            OT Short Term Goals - 03/27/20 1027      OT SHORT TERM GOAL #1   Title Pt will be provided with and educated on HEP to improve use of RUE as non-dominant during ADLs.    Time 4    Period Weeks    Status On-going    Target Date 04/18/20      OT SHORT TERM GOAL #2   Title Pt will  decrease pain in RUE to 3/10 or less to improve ability to sleep at night for 4+ consecutive hours without waking due to pain.    Time 4    Period Weeks    Status On-going      OT SHORT TERM GOAL #3   Title Pt will increase right grip strength by 15# and pinch by 3# to improve ability to maintain grasp on items during functional use.    Time 4    Period Weeks    Status On-going      OT SHORT TERM GOAL #4   Title Pt will report increased activity tolerance when using RUE to improve ability to perform work tasks.    Time 4    Period Weeks    Status On-going                    Plan - 03/27/20 1025    Clinical Impression Statement A: Reviewed goals and initiated myofascial release. Trace fascial restrictions noted this date. Per MD's new order (scanned into Media tab), strengthening was initiated to begin progressing as tolerated. Muscle fatigue noted during hangripper task and putty task with rest breaks taken as needed. VC for form and technique were provided. Did not complete pinch with putty as patien reports increased soreness.    Body Structure / Function / Physical Skills ADL;Endurance;UE functional use;Fascial restriction;Pain;ROM;IADL;Strength    Plan P: Try pinch strengthening with green putty if able to tolerate.           Patient will benefit from skilled therapeutic intervention in order to improve the following deficits and impairments:   Body Structure / Function / Physical Skills: ADL,Endurance,UE functional use,Fascial restriction,Pain,ROM,IADL,Strength       Visit Diagnosis: Stiffness of right wrist, not elsewhere classified  Other symptoms and signs involving the musculoskeletal system  Pain in right wrist    Problem List Patient Active Problem List   Diagnosis Date Noted  . Pre-operative cardiovascular examination   . PAF (paroxysmal atrial fibrillation) ([place])   . Closed Galeazzi's fracture of left radius 09/30/2019  . Rectal bleeding  08/12/2016   Ailene Ravel, OTR/L,CBIS  [phone]  03/27/2020, 10:51 AM  [facility]  [place], [place], [address] Phone: [phone]   Fax:  [phone]  Name: Joe [name] MRN: [id number] Date of Birth: Sep 26, 1970

## 2020-04-03 ENCOUNTER — Ambulatory Visit (HOSPITAL_COMMUNITY): Payer: Managed Care, Other (non HMO)

## 2020-04-03 ENCOUNTER — Other Ambulatory Visit: Payer: Self-pay

## 2020-04-03 DIAGNOSIS — M25631 Stiffness of right wrist, not elsewhere classified: Secondary | ICD-10-CM | POA: Diagnosis not present

## 2020-04-03 DIAGNOSIS — M25531 Pain in right wrist: Secondary | ICD-10-CM

## 2020-04-03 DIAGNOSIS — R29898 Other symptoms and signs involving the musculoskeletal system: Secondary | ICD-10-CM

## 2020-04-03 NOTE — Therapy (Signed)
[facility] [address] [place], [place], [address] Phone: [phone]   Fax:  [phone]  Occupational Therapy Treatment  Patient Details  Name: Joe [name] MRN: [id number] Date of Birth: [birth year] Referring Provider (OT): Patrecia Pace, [place] Lennette Bihari Haddix-surgeon)   Encounter Date: 04/03/2020   OT End of Session - 04/03/20 1058    Visit Number 3    Number of Visits 4    Date for OT Re-Evaluation 04/18/20    Authorization Type cigna Managed    Authorization Time Period 30 visit limit. Visit limit started over at start of calendar year.    Authorization - Visit Number 2    Authorization - Number of Visits 30    OT Start Time [phone]    OT Stop Time 1028    OT Time Calculation (min) 38 min    Activity Tolerance Patient tolerated treatment well    Behavior During Therapy WFL for tasks assessed/performed           Past Medical History:  Diagnosis Date  . Asthma    as a child  . Atrial fibrillation ([place])   . Dysrhythmia    Afib with RVR 01/21/2016  . Hematuria   . History of kidney stones    passed  . Lung collapse    partial collasped lung  . Pneumonia     Past Surgical History:  Procedure Laterality Date  . CHEST TUBE INSERTION    . COLONOSCOPY WITH PROPOFOL N/A 09/28/2016   Procedure: COLONOSCOPY WITH PROPOFOL;  Surgeon: Daneil Dolin, MD;  [facility];  Service: Endoscopy;  Laterality: N/A;  1215  . ORIF RADIAL FRACTURE Right 10/02/2019   Procedure: OPEN REDUCTION INTERNAL FIXATION (ORIF) RADIAL FRACTURE;  Surgeon: Shona Needles, MD;  Location: [facility];  Service: Orthopedics;  Laterality: Right;  . POLYPECTOMY  09/28/2016   Procedure: POLYPECTOMY;  Surgeon: Daneil Dolin, MD;  [facility];  Service: Endoscopy;;  sigmoid colon polyps times 3  cs and splenic flexure polyp    There were no vitals filed for this visit.   Subjective Assessment - 04/03/20 0951    Currently in Pain? Yes     Pain Score 3     Pain Location Wrist   top of wrist and forearm   Pain Orientation Right    Pain Descriptors / Indicators Sore    Pain Type Acute pain    Pain Onset Yesterday    Pain Frequency Constant    Aggravating Factors  using it a lot at work    Pain Relieving Factors rest, pain medication    Effect of Pain on Daily Activities Pt is able to push through right now.    Multiple Pain Sites No                        OT Treatments/Exercises (OP) - 04/03/20 1004      Exercises   Exercises Hand;Theraputty      Hand Exercises   Sponges Utilizing blue resistive clothespin and 3 point pinch pt picked up 20 sponges and stacked 2 at a time focusing on pinch strength.    Other Hand Exercises A/ROM Composite finger flexion/extension 10X, wrist flexion/extension 10X      Modalities   Modalities Ultrasound      Ultrasound   Ultrasound Location volar forearm along incision    Ultrasound Parameters 1.5 W/cm2    Ultrasound Goals Pain  Manual Therapy   Manual Therapy Myofascial release    Manual therapy comments Manual therapy completed prior to exercises.    Myofascial Release Myofascial release completed to right forearm along healed incision to assess fascial restrictions and scar tissue.                    OT Short Term Goals - 03/27/20 1027      OT SHORT TERM GOAL #1   Title Pt will be provided with and educated on HEP to improve use of RUE as non-dominant during ADLs.    Time 4    Period Weeks    Status On-going    Target Date 04/18/20      OT SHORT TERM GOAL #2   Title Pt will decrease pain in RUE to 3/10 or less to improve ability to sleep at night for 4+ consecutive hours without waking due to pain.    Time 4    Period Weeks    Status On-going      OT SHORT TERM GOAL #3   Title Pt will increase right grip strength by 15# and pinch by 3# to improve ability to maintain grasp on items during functional use.    Time 4    Period Weeks     Status On-going      OT SHORT TERM GOAL #4   Title Pt will report increased activity tolerance when using RUE to improve ability to perform work tasks.    Time 4    Period Weeks    Status On-going                    Plan - 04/03/20 1059    Clinical Impression Statement A: Pt arrived after working third shift and reports increased hand fatigue and soreness from general use during his 12 hour shift. Manual techniques such as myofascial release and [place] were utilized to address pain while finishing with light pinch strengthening task. No increased pain was reported at end of session. VC for form and technique were provided.    Body Structure / Function / Physical Skills ADL;Endurance;UE functional use;Fascial restriction;Pain;ROM;IADL;Strength    Plan P: Continue with progressing strengthening as able to tolerate.    Consulted and Agree with Plan of Care Patient           Patient will benefit from skilled therapeutic intervention in order to improve the following deficits and impairments:   Body Structure / Function / Physical Skills: ADL,Endurance,UE functional use,Fascial restriction,Pain,ROM,IADL,Strength       Visit Diagnosis: Pain in right wrist  Other symptoms and signs involving the musculoskeletal system  Stiffness of right wrist, not elsewhere classified    Problem List Patient Active Problem List   Diagnosis Date Noted  . Pre-operative cardiovascular examination   . PAF (paroxysmal atrial fibrillation) ([place])   . Closed Galeazzi's fracture of left radius 09/30/2019  . Rectal bleeding 08/12/2016   Ailene Ravel, OTR/L,CBIS  [phone]   04/03/2020, 11:08 AM  [facility] [address] [place], [place], [address] Phone: [phone]   Fax:  [phone]  Name: TRISTIAN SICKINGER MRN: [id number] Date of Birth: 1971/03/19

## 2020-04-10 ENCOUNTER — Encounter (HOSPITAL_COMMUNITY): Payer: Managed Care, Other (non HMO) | Admitting: Occupational Therapy

## 2020-04-17 ENCOUNTER — Ambulatory Visit (HOSPITAL_COMMUNITY): Payer: Managed Care, Other (non HMO) | Admitting: Occupational Therapy

## 2020-04-24 ENCOUNTER — Other Ambulatory Visit: Payer: Self-pay

## 2020-04-24 ENCOUNTER — Other Ambulatory Visit (HOSPITAL_COMMUNITY): Payer: Self-pay | Admitting: Family Medicine

## 2020-04-24 ENCOUNTER — Ambulatory Visit (HOSPITAL_COMMUNITY)
Admission: RE | Admit: 2020-04-24 | Discharge: 2020-04-24 | Disposition: A | Discharge status: Home / Self Care | Payer: Managed Care, Other (non HMO) | Source: Ambulatory Visit | Attending: Family Medicine | Admitting: Family Medicine

## 2020-04-24 DIAGNOSIS — M25551 Pain in right hip: Secondary | ICD-10-CM | POA: Insufficient documentation

## 2020-04-24 DIAGNOSIS — M5459 Other low back pain: Secondary | ICD-10-CM | POA: Insufficient documentation

## 2020-08-20 ENCOUNTER — Encounter (HOSPITAL_COMMUNITY): Payer: Self-pay

## 2020-08-20 NOTE — Therapy (Signed)
[facility] [place], [place], [address] Phone: [phone]   Fax:  [phone]  Patient Details  Name: Joe [name] MRN: [id number] Date of Birth: Aug 03, 1970 Referring Provider:  No ref. provider found  Encounter Date: 08/20/2020  OCCUPATIONAL THERAPY DISCHARGE SUMMARY  Visits from Start of Care: 3  Current functional level related to goals / functional outcomes: Title Pt will be provided with and educated on HEP to improve use of RUE as non-dominant during ADLs.     Time 4    Period Weeks    Status On-going    Target Date 04/18/20        OT SHORT TERM GOAL #2   Title Pt will decrease pain in RUE to 3/10 or less to improve ability to sleep at night for 4+ consecutive hours without waking due to pain.    Time 4    Period Weeks    Status On-going        OT SHORT TERM GOAL #3   Title Pt will increase right grip strength by 15# and pinch by 3# to improve ability to maintain grasp on items during functional use.    Time 4    Period Weeks    Status On-going        OT SHORT TERM GOAL #4   Title Pt will report increased activity tolerance when using RUE to improve ability to perform work tasks.    Time 4    Period Weeks    Status On-going         Remaining deficits: As patient did not return since last visit unknown on status remaining deficits. At evaluation, patient demonstrated decreased activity tolerance of the RUE, decreased grip and pinch strength, increased pain.    Education / Equipment: None established Plan:                                                    Patient goals were not met. Patient is being discharged due to not returning since the last visit.  ?????         Ailene Ravel, OTR/L,CBIS  [phone]  08/20/2020, 3:36 PM  [facility] [address] [place], [place], [address] Phone: [phone]   Fax:  [phone]

## 2020-09-23 ENCOUNTER — Encounter (HOSPITAL_COMMUNITY): Payer: Self-pay

## 2021-09-10 ENCOUNTER — Encounter: Payer: Self-pay | Admitting: *Deleted
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
## 2022-02-08 IMAGING — DX DG FOREARM 2V*R*
2 series · 2 of 2 positions shown · non-contrast
Comparison: Earlier same day

CLINICAL DATA: Postop right forearm fracture

EXAM:
RIGHT FOREARM - 2 VIEW

[forearm ap]
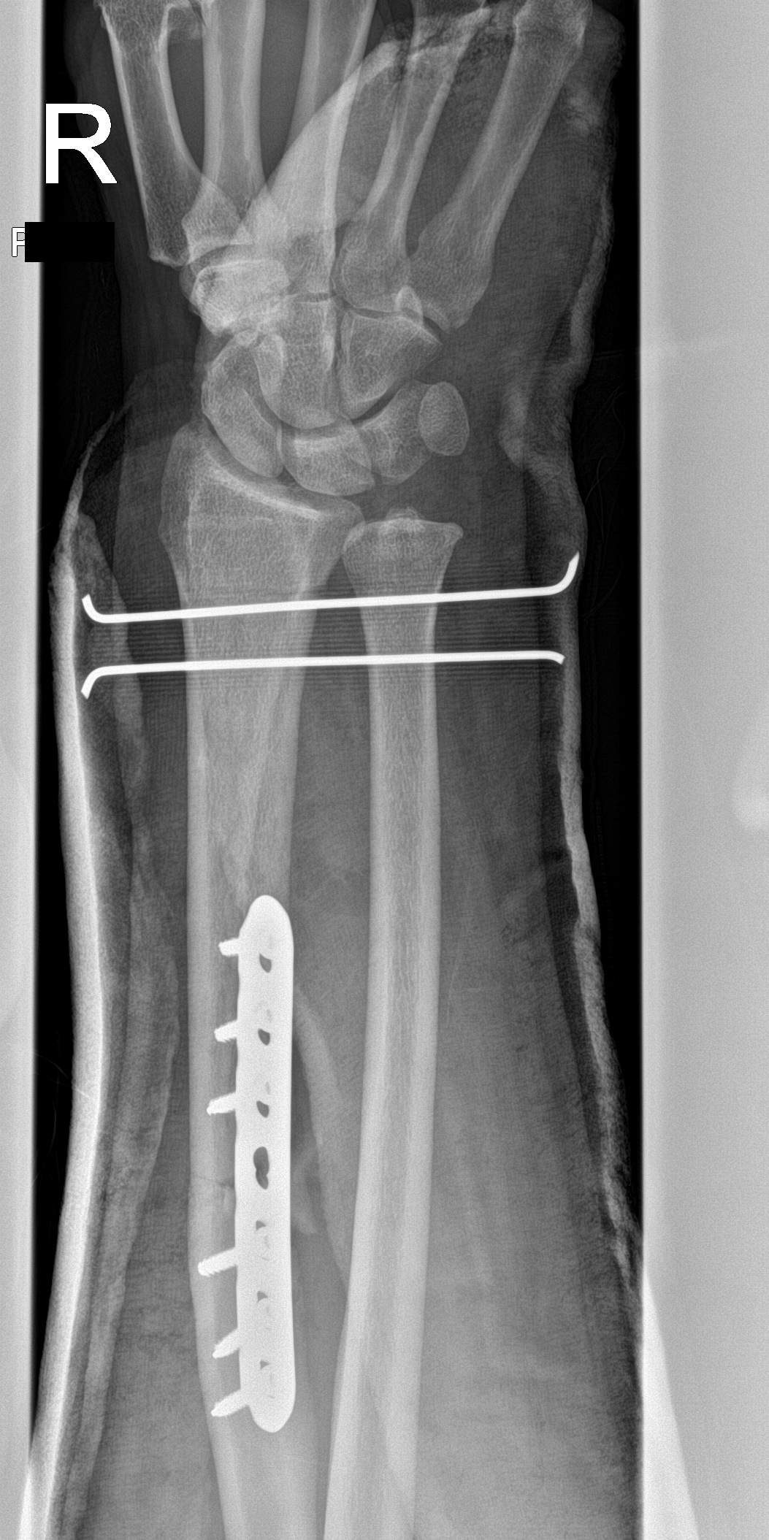

[forearm lat]
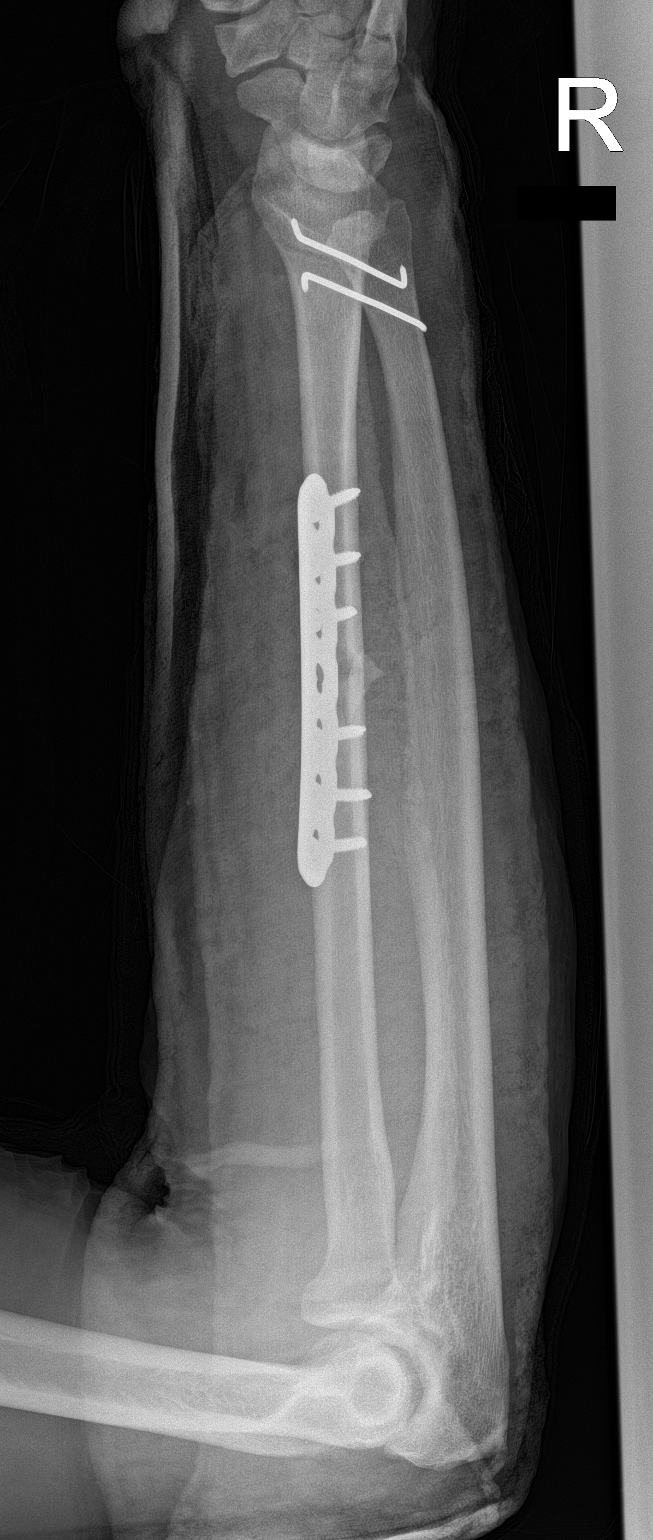

[2 of 2 positions shown; findings below may reference images not displayed]

FINDINGS: Plate and screw fixation of a transverse fracture at the junction of
the mid and distal thirds of the radial diaphysis. Two pins placed
for treatment of interosseous ligament injury at the distal
radioulnar region. Components appear well positioned and anatomic
relationships have been restored.
IMPRESSION: Plate and screw fixation of a radial fracture. Two pins placed for
restoration of normal anatomic relationship of the distal radius and
ulna.

## 2022-02-15 ENCOUNTER — Other Ambulatory Visit: Payer: Self-pay

## 2022-02-15 ENCOUNTER — Encounter (HOSPITAL_COMMUNITY): Payer: Self-pay | Admitting: Emergency Medicine

## 2022-02-15 ENCOUNTER — Emergency Department (HOSPITAL_COMMUNITY)
Admission: EM | Admit: 2022-02-15 | Discharge: 2022-02-15 | Disposition: A | Discharge status: Home / Self Care | Payer: Worker's Compensation | Attending: Emergency Medicine | Admitting: Emergency Medicine

## 2022-02-15 DIAGNOSIS — X501XXA Overexertion from prolonged static or awkward postures, initial encounter: Secondary | ICD-10-CM | POA: Diagnosis not present

## 2022-02-15 DIAGNOSIS — M545 Low back pain, unspecified: Secondary | ICD-10-CM | POA: Diagnosis present

## 2022-02-15 DIAGNOSIS — S39012A Strain of muscle, fascia and tendon of lower back, initial encounter: Secondary | ICD-10-CM | POA: Diagnosis not present

## 2022-02-15 DIAGNOSIS — Y990 Civilian activity done for income or pay: Secondary | ICD-10-CM | POA: Diagnosis not present

## 2022-02-15 MED ORDER — METHOCARBAMOL 500 MG PO TABS: 500.0000 mg | ORAL_TABLET | Freq: Three times a day (TID) | ORAL | 0 refills | Status: DC | PRN

## 2022-02-15 MED ORDER — IBUPROFEN 800 MG PO TABS: 800.0000 mg | ORAL_TABLET | Freq: Four times a day (QID) | ORAL | 0 refills | Status: DC | PRN

## 2022-02-15 MED ORDER — HYDROCODONE-ACETAMINOPHEN 5-325 MG PO TABS: 1.0000 | ORAL_TABLET | ORAL | 0 refills | Status: DC | PRN

## 2022-02-15 NOTE — ED Triage Notes (Signed)
Pt with c/o lower back pain that started when he was at work. 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 Denies any radiation of pain.

## 2022-02-15 NOTE — ED Provider Notes (Signed)
[facility] EMERGENCY DEPARTMENT Provider Note   CSN: [id number] Arrival date & time: 02/15/22  0047     History  Chief Complaint  Patient presents with   Back Pain    Joe [name] is a 51 y.o. male.  Patient presents to the emergency department for evaluation of back pain.  Patient reports that he turned to the left to move a heavy drum and felt sudden pain in the back.  He is experiencing pain diffusely across his back, but mostly on the left lower portion.  Pain does not radiate.  No numbness, weakness, change in sensation of the lower extremities, no change in bowel or bladder function.       Home Medications Prior to Admission medications   Medication Sig Start Date End Date Taking? Authorizing Provider  HYDROcodone-acetaminophen (NORCO/VICODIN) 5-325 MG tablet Take 1 tablet by mouth every 4 (four) hours as needed. 02/15/22  Yes Kennan Detter, Gwenyth Allegra, MD  ibuprofen (ADVIL) 800 MG tablet Take 1 tablet (800 mg total) by mouth every 6 (six) hours as needed for moderate pain. 02/15/22  Yes Lorie Melichar, Gwenyth Allegra, MD  methocarbamol (ROBAXIN) 500 MG tablet Take 1 tablet (500 mg total) by mouth every 8 (eight) hours as needed for muscle spasms. 02/15/22  Yes Roey Coopman, Gwenyth Allegra, MD  acetaminophen (TYLENOL) 500 MG tablet Take 500 mg by mouth every 6 (six) hours as needed.    [provider]      Allergies    Patient has no known allergies.    Review of Systems   Review of Systems  Physical Exam Updated Vital Signs BP (!) 135/92 (BP Location: Right Arm)   Pulse 79   Temp 98.7 F (37.1 C) (Oral)   Resp 18   Ht '6\' 1"'$  (1.854 m)   Wt 113.4 kg   SpO2 100%   BMI 32.98 kg/m  Physical Exam Vitals and nursing note reviewed.  Constitutional:      General: He is not in acute distress.    Appearance: He is well-developed.  HENT:     Head: Normocephalic and atraumatic.     Mouth/Throat:     Mouth: Mucous membranes are moist.  Eyes:     General: Vision  grossly intact. Gaze aligned appropriately.     Extraocular Movements: Extraocular movements intact.     Conjunctiva/sclera: Conjunctivae normal.  Cardiovascular:     Rate and Rhythm: Normal rate and regular rhythm.     Pulses: Normal pulses.     Heart sounds: Normal heart sounds, S1 normal and S2 normal. No murmur heard.    No friction rub. No gallop.  Pulmonary:     Effort: Pulmonary effort is normal. No respiratory distress.     Breath sounds: Normal breath sounds.  Abdominal:     Palpations: Abdomen is soft.     Tenderness: There is no abdominal tenderness. There is no guarding or rebound.     Hernia: No hernia is present.  Musculoskeletal:        General: No swelling.     Cervical back: Full passive range of motion without pain, normal range of motion and neck supple. No pain with movement, spinous process tenderness or muscular tenderness. Normal range of motion.     Lumbar back: Tenderness present. Negative right straight leg raise test and negative left straight leg raise test.       Back:     Right lower leg: No edema.     Left lower leg: No edema.  Skin:    General: Skin is warm and dry.     Capillary Refill: Capillary refill takes less than 2 seconds.     Findings: No ecchymosis, erythema, lesion or wound.  Neurological:     Mental Status: He is alert and oriented to person, place, and time.     GCS: GCS eye subscore is 4. GCS verbal subscore is 5. GCS motor subscore is 6.     Cranial Nerves: Cranial nerves 2-12 are intact.     Sensory: Sensation is intact.     Motor: Motor function is intact. No weakness or abnormal muscle tone.     Coordination: Coordination is intact.  Psychiatric:        Mood and Affect: Mood normal.        Speech: Speech normal.        Behavior: Behavior normal.     ED Results / Procedures / Treatments   Labs (all labs ordered are listed, but only abnormal results are displayed) Labs Reviewed - No data to  display  EKG None  Radiology No results found.  Procedures Procedures    Medications Ordered in ED Medications - No data to display  ED Course/ Medical Decision Making/ A&P                           Medical Decision Making  Patient presents to the ER with musculoskeletal back pain. Examination reveals back tenderness without any associated neurologic findings. Patient's strength, sensation and reflexes were normal. There is no evidence of saddle anesthesia. Patient does not have a foot drop. Patient has not experienced any change in bowel or bladder function. As such, patient did not require any imaging or further studies. Patient was treated with analgesia.        Final Clinical Impression(s) / ED Diagnoses Final diagnoses:  Strain of lumbar region, initial encounter    Rx / DC Orders ED Discharge Orders          Ordered    ibuprofen (ADVIL) 800 MG tablet  Every 6 hours PRN        02/15/22 0157    methocarbamol (ROBAXIN) 500 MG tablet  Every 8 hours PRN        02/15/22 0157    HYDROcodone-acetaminophen (NORCO/VICODIN) 5-325 MG tablet  Every 4 hours PRN        02/15/22 0158              Orpah Greek, MD 02/15/22 [phone]

## 2022-02-16 MED FILL — Hydrocodone-Acetaminophen Tab 5-325 MG: ORAL | Qty: 6 | Status: AC

## 2023-10-12 ENCOUNTER — Emergency Department (HOSPITAL_COMMUNITY)
Admission: EM | Admit: 2023-10-12 | Discharge: 2023-10-13 | Disposition: A | Discharge status: Home / Self Care | Attending: Emergency Medicine | Admitting: Emergency Medicine

## 2023-10-12 ENCOUNTER — Other Ambulatory Visit: Payer: Self-pay

## 2023-10-12 ENCOUNTER — Encounter (HOSPITAL_COMMUNITY): Payer: Self-pay | Admitting: *Deleted

## 2023-10-12 ENCOUNTER — Emergency Department (HOSPITAL_COMMUNITY)

## 2023-10-12 DIAGNOSIS — M5442 Lumbago with sciatica, left side: Secondary | ICD-10-CM | POA: Insufficient documentation

## 2023-10-12 DIAGNOSIS — M545 Low back pain, unspecified: Secondary | ICD-10-CM | POA: Diagnosis present

## 2023-10-12 LAB — URINALYSIS, ROUTINE W REFLEX MICROSCOPIC
Bilirubin Urine: NEGATIVE
Glucose, UA: NEGATIVE mg/dL
Hgb urine dipstick: NEGATIVE
Ketones, ur: NEGATIVE mg/dL
Leukocytes,Ua: NEGATIVE
Nitrite: NEGATIVE
Protein, ur: NEGATIVE mg/dL
Specific Gravity, Urine: 1.026 (ref 1.005–1.030)
pH: 5 (ref 5.0–8.0)

## 2023-10-12 MED ORDER — HYDROMORPHONE HCL 1 MG/ML IJ SOLN
1.0000 mg | Freq: Once | INTRAMUSCULAR | Status: AC
  Administered 2023-10-12: 1 mg via INTRAMUSCULAR
  Filled 2023-10-12: qty 1

## 2023-10-12 MED ORDER — METHOCARBAMOL 500 MG PO TABS: 500.0000 mg | ORAL_TABLET | Freq: Three times a day (TID) | ORAL | 0 refills | Status: DC

## 2023-10-12 MED ORDER — PREDNISONE 10 MG PO TABS: ORAL_TABLET | ORAL | 0 refills | Status: AC

## 2023-10-12 MED ORDER — OXYCODONE-ACETAMINOPHEN 5-325 MG PO TABS: 1.0000 | ORAL_TABLET | Freq: Four times a day (QID) | ORAL | 0 refills | Status: DC | PRN

## 2023-10-12 MED ORDER — OXYCODONE-ACETAMINOPHEN 5-325 MG PO TABS: 1.0000 | ORAL_TABLET | ORAL | 0 refills | Status: DC | PRN

## 2023-10-12 NOTE — ED Triage Notes (Signed)
 Pt with back pain since Monday. Pt states pain got worse while at work Monday night and went home early.  Pt seen at [facility] and was prescribed pain and muscle relaxer meds, pt states meds not helping. Pt states he is not able to walk, pain radiates down left leg.

## 2023-10-12 NOTE — ED Provider Notes (Signed)
 [place] EMERGENCY DEPARTMENT AT [facility] Provider Note   CSN: [id number] Arrival date & time: 10/12/23  1753     Patient presents with: Back Pain   Joe [name] is a 53 y.o. male.    Back Pain Associated symptoms: no abdominal pain, no chest pain, no fever, no numbness and no weakness        JABAR KRYSIAK is a 53 y.o. male who presents to the Emergency Department complaining of left sided low back pain x 4 days.  He began having back pain while at work.  Describes a stabbing, burning pain from left lower back that radiates down his left buttock and left leg.  He denies known injury but admits that he has to lift multiple heavy objects during his work.  He was seen previously at urgent care and given medication for pain and muscle relaxer which have not given any relief.  He states the pain is worse with weight bearing and movement.  He states he is having difficulty walking or standing secondary to pain but his pain resolves while at rest.  He denies fever, abdominal pain, numbness or weakness of the affected extremity, urine or bowel changes, and pain or numbness or his groin or genital region.  He is requesting pain control until an appt he has with [facility] on Monday   Prior to Admission medications   Medication Sig Start Date End Date Taking? Authorizing Provider  methocarbamol  (ROBAXIN ) 500 MG tablet Take 1 tablet (500 mg total) by mouth 3 (three) times daily. 10/12/23  Yes Raysa Bosak, PA-C  oxyCODONE -acetaminophen  (PERCOCET/ROXICET) 5-325 MG tablet Take 1 tablet by mouth every 6 (six) hours as needed for severe pain (pain score 7-10). 10/12/23  Yes Shalyn Koral, PA-C  oxyCODONE -acetaminophen  (PERCOCET/ROXICET) 5-325 MG tablet Take 1 tablet by mouth every 4 (four) hours as needed. 10/12/23  Yes Jakhari Space, PA-C  predniSONE  (DELTASONE ) 10 MG tablet Take 6 tablets day one, 5 tablets day two, 4 tablets day three, 3 tablets day four, 2 tablets day  five, then 1 tablet day six 10/12/23  Yes Alyza Artiaga, PA-C    Allergies: Patient has no known allergies.    Review of Systems  Constitutional:  Negative for appetite change and fever.  Respiratory:  Negative for chest tightness and shortness of breath.   Cardiovascular:  Negative for chest pain.  Gastrointestinal:  Negative for abdominal pain, diarrhea, nausea and vomiting.  Genitourinary:  Negative for decreased urine volume, difficulty urinating, flank pain, penile pain, scrotal swelling and testicular pain.  Musculoskeletal:  Positive for back pain.  Skin:  Negative for rash.  Neurological:  Negative for weakness and numbness.    Updated Vital Signs BP (!) 139/104 (BP Location: Right Arm)   Pulse 61   Temp 98.5 F (36.9 C) (Oral)   Resp 18   Ht 6' 1 (1.854 m)   Wt 113.4 kg   SpO2 99%   BMI 32.98 kg/m   Physical Exam Vitals and nursing note reviewed.  Constitutional:      General: He is not in acute distress.    Appearance: Normal appearance. He is not toxic-appearing.  Cardiovascular:     Rate and Rhythm: Normal rate and regular rhythm.     Pulses: Normal pulses.  Pulmonary:     Effort: Pulmonary effort is normal.  Chest:     Chest wall: No tenderness.  Abdominal:     Palpations: Abdomen is soft.     Tenderness:  There is no abdominal tenderness.  Musculoskeletal:     Lumbar back: Tenderness present. No swelling. Decreased range of motion. Positive left straight leg raise test. Negative right straight leg raise test.     Right lower leg: No edema.     Left lower leg: No edema.     Comments: Ttp of left lumbar paraspinal muscles.  Tender at the left SI joint space.  Pos SLR on left, hip flexors and extensors intact.  5/5 motor strength of BLE's  Skin:    General: Skin is warm.     Capillary Refill: Capillary refill takes less than 2 seconds.     Findings: No erythema or rash.  Neurological:     General: No focal deficit present.     Mental Status: He is  alert.     Sensory: No sensory deficit.     Motor: No weakness.     (all labs ordered are listed, but only abnormal results are displayed) Labs Reviewed  URINALYSIS, ROUTINE W REFLEX MICROSCOPIC    EKG: None  Radiology: CT Lumbar Spine Wo Contrast Result Date: 10/12/2023 CLINICAL DATA:  Low back pain onset 4 days ago, worsening with work. No improvement with muscle relaxers and pain medication. Loss of ambulatory status with pain radiating down left leg. EXAM: CT LUMBAR SPINE WITHOUT CONTRAST TECHNIQUE: Multidetector CT imaging of the lumbar spine was performed without intravenous contrast administration. Multiplanar CT image reconstructions were also generated. RADIATION DOSE REDUCTION: This exam was performed according to the departmental dose-optimization program which includes automated exposure control, adjustment of the mA and/or kV according to patient size and/or use of iterative reconstruction technique. COMPARISON:  Lumbar spine plain films 04/24/2020, CT abdomen pelvis 09/27/2019. FINDINGS: Segmentation: 5 lumbar type vertebrae. Alignment: Normal. Vertebrae: There is normal bone mineralization with no evidence of fractures. There is moderate spondylosis. Endplate Schmorl's nodes are present T11-12 through L2-3, as before. No focal pathologic process is seen. Paraspinal and other soft tissues: No mass, hematoma or fluid collections. No aortic aneurysm. Scattered calcific plaque both common iliac, internal iliac arteries. No aortic calcification. Disc levels: There is mild disc space loss again at T11-12, T12-L1 and L1-2. Below L2 the other lumbar discs are normal in heights. No bulge, herniation or stenosis is seen from T11-12 through L1-2. At L2-3 there is a mild diffuse annular bulge, without herniation or stenosis. There are mild facet spurs without foraminal encroachment. At L3-4, mild diffuse annular bulge and dorsal epidural fat combined cause 8 mm AP thecal sac narrowing. There is no  herniation. There is mild-to-moderate facet spurring without foraminal compromise. This mildly effaces the subarticular zones. At L4-5 broad-based disc protrusion eccentric to the right has increased since 2021 causing 6.5 mm thecal sac AP stenosis. Mass-effect suspected on both descending L5 nerve roots. There are mild facet spurs and mild foraminal stenosis. At L5-S1, there is a partially rim calcified mild undulating posterior disc bulge, lateralizing to the right where it is slightly effaces the ventral sheaths of the right S1 nerve root without nerve root displacement or compression. This was seen previously and seems unchanged. There is no herniation or canal zone stenosis. There are mild facet spurs and spondylosis with inferior foraminal disc bulging contributing to multifactorial moderate bilateral L5-S1 foraminal stenosis. Other: Left SI joint is patent. Bridging osteophytes are again seen across the anterior right SI joint. IMPRESSION: 1. No evidence of fractures or focal pathologic process. 2. Spondylosis and degenerative disc disease. 3. L4-5 broad-based disc protrusion eccentric  to the right has increased since 2021 causing 6.5 mm thecal sac AP stenosis with mass-effect suspected on both descending L5 nerve roots. 4. L3-4 8 mm AP thecal sac narrowing due to bulge and dorsal epidural fat, with mild effacement along the subarticular zones due to facet spurs. 5. L5-S1 moderate multifactorial bilateral foraminal stenosis. 6. Iliac atherosclerosis. Electronically Signed   By: Francis Quam M.D.   On: 10/12/2023 22:55     Procedures   Medications Ordered in the ED  HYDROmorphone  (DILAUDID ) injection 1 mg (1 mg Intramuscular Given 10/12/23 2207)                                    Medical Decision Making Left low back pain x 4 days, began while at work.  No known injury but admits that he does a lot of heavy lifting.  He is having left low back pain that radiates into left buttock, hip and down  his left leg.  Pain worsens with weight bearing and resolves while at rest. No saddle anesthesia's, numbness or weakness of the LE's.  No urine or bowel changes.  Possible sciatica vs herniated disk.  Cauda equina also considered.  Doubt infectious process  Amount and/or Complexity of Data Reviewed Labs: ordered.    Details: U/a neg Radiology: ordered.    Details: CT Lumbar Spine Wo Contrast  1. No evidence of fractures or focal pathologic process. 2. Spondylosis and degenerative disc disease. 3. L4-5 broad-based disc protrusion eccentric to the right has increased since 2021 causing 6.5 mm thecal sac AP stenosis with mass-effect suspected on both descending L5 nerve roots. 4. L3-4 8 mm AP thecal sac narrowing due to bulge and dorsal epidural fat, with mild effacement along the subarticular zones due to facet spurs. 5. L5-S1 moderate multifactorial bilateral foraminal stenosis.    Discussion of management or test interpretation with external provider(s):   Discussed care plan with Dr. Towana.    Pt without red flags on exam.  He has concerning findings on CT of the L spine possibly related to nerve impingement bilaterally but pt does not have right sided symptoms and CT is w/o evidence of canal stenosis or herniation.    On recheck, pt is now able to ambulate after pain medication and reports feeling much better.  No focal neuro deficits on exam, no foot drop, gait steady. I do not have MRI capability here after hours,  I have offered to keep pt here for MRI in morning and/or consultation with neurosurgery.  Pt declined, states that he would prefer to go home and keep his appt with [facility] on Monday.  I have given strict return precautions and he agrees to plan and I will provide neurosurgery f/u info as well.  Risk Prescription drug management.        Final diagnoses:  Acute left-sided low back pain with left-sided sciatica    ED Discharge Orders          Ordered     oxyCODONE -acetaminophen  (PERCOCET/ROXICET) 5-325 MG tablet  Every 6 hours PRN        10/12/23 2335    predniSONE  (DELTASONE ) 10 MG tablet        10/12/23 2337    methocarbamol  (ROBAXIN ) 500 MG tablet  3 times daily        10/12/23 2337    oxyCODONE -acetaminophen  (PERCOCET/ROXICET) 5-325 MG tablet  Every 4 hours PRN  10/12/23 2347               Herlinda Milling, PA-C 10/13/23 2310    Towana Ozell BROCKS, MD 10/14/23 [phone]

## 2023-10-12 NOTE — ED Provider Notes (Incomplete)
 [place] EMERGENCY DEPARTMENT AT [facility] Provider Note   CSN: [id number] Arrival date & time: 10/12/23  1753     Patient presents with: Back Pain   Joe [name] is a 53 y.o. male.  {Add pertinent medical, surgical, social history, OB history to HPI:32947}  Back Pain      Prior to Admission medications   Medication Sig Start Date End Date Taking? Authorizing Provider  methocarbamol  (ROBAXIN ) 500 MG tablet Take 1 tablet (500 mg total) by mouth 3 (three) times daily. 10/12/23  Yes Sharayah Renfrow, PA-C  oxyCODONE -acetaminophen  (PERCOCET/ROXICET) 5-325 MG tablet Take 1 tablet by mouth every 6 (six) hours as needed for severe pain (pain score 7-10). 10/12/23  Yes Aleksis Jiggetts, PA-C  oxyCODONE -acetaminophen  (PERCOCET/ROXICET) 5-325 MG tablet Take 1 tablet by mouth every 4 (four) hours as needed. 10/12/23  Yes [facility], PA-C  predniSONE  (DELTASONE ) 10 MG tablet Take 6 tablets day one, 5 tablets day two, 4 tablets day three, 3 tablets day four, 2 tablets day five, then 1 tablet day six 10/12/23  Yes Doral Ventrella, PA-C    Allergies: Patient has no known allergies.    Review of Systems  Musculoskeletal:  Positive for back pain.    Updated Vital Signs BP (!) 139/104 (BP Location: Right Arm)   Pulse 61   Temp 98.5 F (36.9 C) (Oral)   Resp 18   Ht 6' 1 (1.854 m)   Wt 113.4 kg   SpO2 99%   BMI 32.98 kg/m   Physical Exam  (all labs ordered are listed, but only abnormal results are displayed) Labs Reviewed  URINALYSIS, ROUTINE W REFLEX MICROSCOPIC    EKG: None  Radiology: CT Lumbar Spine Wo Contrast Result Date: 10/12/2023 CLINICAL DATA:  Low back pain onset 4 days ago, worsening with work. No improvement with muscle relaxers and pain medication. Loss of ambulatory status with pain radiating down left leg. EXAM: CT LUMBAR SPINE WITHOUT CONTRAST TECHNIQUE: Multidetector CT imaging of the lumbar spine was performed without intravenous contrast  administration. Multiplanar CT image reconstructions were also generated. RADIATION DOSE REDUCTION: This exam was performed according to the departmental dose-optimization program which includes automated exposure control, adjustment of the mA and/or kV according to patient size and/or use of iterative reconstruction technique. COMPARISON:  Lumbar spine plain films 04/24/2020, CT abdomen pelvis 09/27/2019. FINDINGS: Segmentation: 5 lumbar type vertebrae. Alignment: Normal. Vertebrae: There is normal bone mineralization with no evidence of fractures. There is moderate spondylosis. Endplate Schmorl's nodes are present T11-12 through L2-3, as before. No focal pathologic process is seen. Paraspinal and other soft tissues: No mass, hematoma or fluid collections. No aortic aneurysm. Scattered calcific plaque both common iliac, internal iliac arteries. No aortic calcification. Disc levels: There is mild disc space loss again at T11-12, T12-L1 and L1-2. Below L2 the other lumbar discs are normal in heights. No bulge, herniation or stenosis is seen from T11-12 through L1-2. At L2-3 there is a mild diffuse annular bulge, without herniation or stenosis. There are mild facet spurs without foraminal encroachment. At L3-4, mild diffuse annular bulge and dorsal epidural fat combined cause 8 mm AP thecal sac narrowing. There is no herniation. There is mild-to-moderate facet spurring without foraminal compromise. This mildly effaces the subarticular zones. At L4-5 broad-based disc protrusion eccentric to the right has increased since 2021 causing 6.5 mm thecal sac AP stenosis. Mass-effect suspected on both descending L5 nerve roots. There are mild facet spurs and mild foraminal stenosis. At L5-S1, there  is a partially rim calcified mild undulating posterior disc bulge, lateralizing to the right where it is slightly effaces the ventral sheaths of the right S1 nerve root without nerve root displacement or compression. This was seen  previously and seems unchanged. There is no herniation or canal zone stenosis. There are mild facet spurs and spondylosis with inferior foraminal disc bulging contributing to multifactorial moderate bilateral L5-S1 foraminal stenosis. Other: Left SI joint is patent. Bridging osteophytes are again seen across the anterior right SI joint. IMPRESSION: 1. No evidence of fractures or focal pathologic process. 2. Spondylosis and degenerative disc disease. 3. L4-5 broad-based disc protrusion eccentric to the right has increased since 2021 causing 6.5 mm thecal sac AP stenosis with mass-effect suspected on both descending L5 nerve roots. 4. L3-4 8 mm AP thecal sac narrowing due to bulge and dorsal epidural fat, with mild effacement along the subarticular zones due to facet spurs. 5. L5-S1 moderate multifactorial bilateral foraminal stenosis. 6. Iliac atherosclerosis. Electronically Signed   By: Francis Quam M.D.   On: 10/12/2023 22:55    {Document cardiac monitor, telemetry assessment procedure when appropriate:32947} Procedures   Medications Ordered in the ED  HYDROmorphone  (DILAUDID ) injection 1 mg (1 mg Intramuscular Given 10/12/23 2207)      {Click here for ABCD2, HEART and other calculators REFRESH Note before signing:1}                              Medical Decision Making Amount and/or Complexity of Data Reviewed Labs: ordered. Radiology: ordered. Discussion of management or test interpretation with external provider(s): Discussed care plan with Dr. Towana.    Pt without red flags on exam.  He has concerning findings on CT of the L spine that possibly related to nreve impingement   On recheck, pt is now ambulatory after pain medication and reports feeling better.  I have offered to keep pt here for MRI in morning and/or consultation with neurosurgery.  Pt declined, states that he would prefer to go home and he has appt with [facility] on Monday.  I have given strict return precautions and he  agrees to plan.   Risk Prescription drug management.   ***  {Document critical care time when appropriate  Document review of labs and clinical decision tools ie [id number], etc  Document your independent review of radiology images and any outside records  Document your discussion with family members, caretakers and with consultants  Document social determinants of health affecting pt's care  Document your decision making why or why not admission, treatments were needed:32947:::1}   Final diagnoses:  Acute left-sided low back pain with left-sided sciatica    ED Discharge Orders          Ordered    oxyCODONE -acetaminophen  (PERCOCET/ROXICET) 5-325 MG tablet  Every 6 hours PRN        10/12/23 2335    predniSONE  (DELTASONE ) 10 MG tablet        10/12/23 2337    methocarbamol  (ROBAXIN ) 500 MG tablet  3 times daily        10/12/23 2337    oxyCODONE -acetaminophen  (PERCOCET/ROXICET) 5-325 MG tablet  Every 4 hours PRN        10/12/23 2347

## 2023-10-12 NOTE — Discharge Instructions (Signed)
 Keep your appt with [facility] for Monday.  it is very important that you keep your appt on Monday for recheck.  Also as discussed, please return to the ER if you develop any new or worsening symptoms

## 2023-10-13 MED FILL — Oxycodone w/ Acetaminophen Tab 5-325 MG: ORAL | Qty: 6 | Status: AC

## 2023-10-14 ENCOUNTER — Emergency Department (HOSPITAL_COMMUNITY)

## 2023-10-14 ENCOUNTER — Encounter (HOSPITAL_COMMUNITY): Payer: Self-pay

## 2023-10-14 ENCOUNTER — Emergency Department (HOSPITAL_COMMUNITY)
Admission: EM | Admit: 2023-10-14 | Discharge: 2023-10-14 | Disposition: A | Discharge status: Home / Self Care | Attending: Emergency Medicine | Admitting: Emergency Medicine

## 2023-10-14 ENCOUNTER — Other Ambulatory Visit: Payer: Self-pay

## 2023-10-14 DIAGNOSIS — F17210 Nicotine dependence, cigarettes, uncomplicated: Secondary | ICD-10-CM | POA: Insufficient documentation

## 2023-10-14 DIAGNOSIS — M545 Low back pain, unspecified: Secondary | ICD-10-CM | POA: Insufficient documentation

## 2023-10-14 DIAGNOSIS — J45909 Unspecified asthma, uncomplicated: Secondary | ICD-10-CM | POA: Insufficient documentation

## 2023-10-14 DIAGNOSIS — M5442 Lumbago with sciatica, left side: Secondary | ICD-10-CM

## 2023-10-14 MED ORDER — HYDROMORPHONE HCL 1 MG/ML IJ SOLN
1.0000 mg | Freq: Once | INTRAMUSCULAR | Status: AC
  Administered 2023-10-14: 1 mg via INTRAMUSCULAR
  Filled 2023-10-14: qty 1

## 2023-10-14 MED ORDER — KETOROLAC TROMETHAMINE 15 MG/ML IJ SOLN
15.0000 mg | Freq: Once | INTRAMUSCULAR | Status: AC
  Administered 2023-10-14: 15 mg via INTRAMUSCULAR
  Filled 2023-10-14: qty 1

## 2023-10-14 MED ORDER — ACETAMINOPHEN 325 MG PO TABS: 650.0000 mg | ORAL_TABLET | Freq: Four times a day (QID) | ORAL | 0 refills | Status: AC | PRN

## 2023-10-14 MED ORDER — CYCLOBENZAPRINE HCL 10 MG PO TABS: 10.0000 mg | ORAL_TABLET | Freq: Two times a day (BID) | ORAL | 0 refills | Status: AC | PRN

## 2023-10-14 MED ORDER — OXYCODONE HCL 5 MG PO TABS: 5.0000 mg | ORAL_TABLET | ORAL | 0 refills | Status: AC | PRN

## 2023-10-14 MED ORDER — CYCLOBENZAPRINE HCL 10 MG PO TABS
10.0000 mg | ORAL_TABLET | Freq: Once | ORAL | Status: AC
  Administered 2023-10-14: 10 mg via ORAL
  Filled 2023-10-14: qty 1

## 2023-10-14 MED ORDER — LIDOCAINE 5 % EX PTCH
1.0000 | MEDICATED_PATCH | Freq: Once | CUTANEOUS | Status: DC
  Administered 2023-10-14: 1 via TRANSDERMAL
  Filled 2023-10-14: qty 1

## 2023-10-14 MED ORDER — LIDOCAINE 5 % EX PTCH: 1.0000 | MEDICATED_PATCH | Freq: Every day | CUTANEOUS | 0 refills | Status: AC | PRN

## 2023-10-14 MED ORDER — ACETAMINOPHEN 500 MG PO TABS
1000.0000 mg | ORAL_TABLET | Freq: Once | ORAL | Status: AC
  Administered 2023-10-14: 1000 mg via ORAL
  Filled 2023-10-14: qty 2

## 2023-10-14 MED ORDER — IBUPROFEN 600 MG PO TABS: 600.0000 mg | ORAL_TABLET | Freq: Four times a day (QID) | ORAL | 0 refills | Status: AC | PRN

## 2023-10-14 NOTE — ED Provider Notes (Signed)
 [place] EMERGENCY DEPARTMENT AT [facility] Provider Note  CSN: [id number] Arrival date & time: 10/14/23 1117  Chief Complaint(s) Back Pain  HPI Joe [name] is a 53 y.o. male with past medical history as below, significant for A-fib, asthma who presents to the ED with complaint of back pain  Patient was seen here on 7/24 for similar complaint.  Patient reporting left-sided low back pain for the past 6 days.  Did have some initial improvement with analgesia provided in ER but has since worsened.  Pain left lumbar, rating down his left leg.  No numbness to his groin, no change in bowel or bladder function, no incontinence or overflow, no IV drug use, no fevers, no recent trauma, no prior spinal surgery or spinal injections per patient.  He has no abdominal pain, nausea or vomiting, no chest pain or dyspnea.  No hematuria.  Pain is at times exacerbated by attempting to transition from seated to standing position with position changes.  Improved with lying down or squatting at times.  Past Medical History Past Medical History:  Diagnosis Date   Asthma    as a child   Atrial fibrillation (HCC)    Dysrhythmia    Afib with RVR 01/21/2016   Hematuria    History of kidney stones    passed   Lung collapse    partial collasped lung   Pneumonia    Patient Active Problem List   Diagnosis Date Noted   Pre-operative cardiovascular examination    PAF (paroxysmal atrial fibrillation) (HCC)    Closed Galeazzi's fracture of left radius 09/30/2019   Rectal bleeding 08/12/2016   Home Medication(s) Prior to Admission medications   Medication Sig Start Date End Date Taking? Authorizing Provider  methocarbamol  (ROBAXIN ) 500 MG tablet Take 1 tablet (500 mg total) by mouth 3 (three) times daily. 10/12/23   Triplett, Tammy, PA-C  oxyCODONE -acetaminophen  (PERCOCET/ROXICET) 5-325 MG tablet Take 1 tablet by mouth every 6 (six) hours as needed for severe pain (pain score 7-10). 10/12/23    Triplett, Tammy, PA-C  oxyCODONE -acetaminophen  (PERCOCET/ROXICET) 5-325 MG tablet Take 1 tablet by mouth every 4 (four) hours as needed. 10/12/23   Triplett, Tammy, PA-C  predniSONE  (DELTASONE ) 10 MG tablet Take 6 tablets day one, 5 tablets day two, 4 tablets day three, 3 tablets day four, 2 tablets day five, then 1 tablet day six 10/12/23   Herlinda Milling, PA-C                                                                                                                                    Past Surgical History Past Surgical History:  Procedure Laterality Date   CHEST TUBE INSERTION     COLONOSCOPY WITH PROPOFOL  N/A 09/28/2016   Procedure: COLONOSCOPY WITH PROPOFOL ;  Surgeon: [name], MD;  [facility];  Service: Endoscopy;  Laterality: N/A;  1215   ORIF RADIAL FRACTURE  Right 10/02/2019   Procedure: OPEN REDUCTION INTERNAL FIXATION (ORIF) RADIAL FRACTURE;  Surgeon: [name], MD;  Location: [facility] OR;  Service: Orthopedics;  Laterality: Right;   POLYPECTOMY  09/28/2016   Procedure: POLYPECTOMY;  Surgeon: [name], MD;  Location: [facility];  Service: Endoscopy;;  sigmoid colon polyps times 3  cs and splenic flexure polyp   Family History Family History  Problem Relation Age of Onset   Colon cancer Neg Hx    Liver disease Neg Hx     Social History Social History   Tobacco Use   Smoking status: Every Day    Current packs/day: 1.00    Types: Cigarettes   Smokeless tobacco: Never  Vaping Use   Vaping status: Never Used  Substance Use Topics   Alcohol use: Yes    Comment: socially, on weekends a pint   Drug use: No   Allergies Patient has no known allergies.  Review of Systems A thorough review of systems was obtained and all systems are negative except as noted in the HPI and PMH.   Physical Exam Vital Signs  I have reviewed the triage vital signs BP 131/88   Pulse 78   Temp 98 F (36.7 C) (Oral)   Resp 18   Ht 6' 1 (1.854 m)   Wt 113.4 kg    SpO2 97%   BMI 32.98 kg/m  Physical Exam Vitals and nursing note reviewed.  Constitutional:      General: He is not in acute distress.    Appearance: Normal appearance. He is well-developed. He is obese. He is not ill-appearing.  HENT:     Head: Normocephalic and atraumatic.     Right Ear: External ear normal.     Left Ear: External ear normal.     Nose: Nose normal.     Mouth/Throat:     Mouth: Mucous membranes are moist.  Eyes:     General: No scleral icterus.       Right eye: No discharge.        Left eye: No discharge.  Cardiovascular:     Rate and Rhythm: Normal rate.  Pulmonary:     Effort: Pulmonary effort is normal. No respiratory distress.     Breath sounds: No stridor.  Abdominal:     General: Abdomen is flat. There is no distension.     Tenderness: There is no guarding.  Musculoskeletal:        General: No deformity.     Cervical back: No rigidity.       Back:     Comments: LE NVI No saddle paresthesia Negative straight leg raise  Skin:    General: Skin is warm and dry.     Coloration: Skin is not cyanotic, jaundiced or pale.  Neurological:     Mental Status: He is alert and oriented to person, place, and time.     GCS: GCS eye subscore is 4. GCS verbal subscore is 5. GCS motor subscore is 6.     Sensory: Sensation is intact. No sensory deficit.     Motor: Motor function is intact. No weakness.  Psychiatric:        Speech: Speech normal.        Behavior: Behavior normal. Behavior is cooperative.     ED Results and Treatments Labs (all labs ordered are listed, but only abnormal results are displayed) Labs Reviewed - No data to display  Radiology No results found.  Pertinent labs & imaging results that were available during my care of the patient were reviewed by me and considered in my medical decision making (see MDM for  details).  Medications Ordered in ED Medications  lidocaine  (LIDODERM ) 5 % 1 patch (1 patch Transdermal Patch Applied 10/14/23 1243)  HYDROmorphone  (DILAUDID ) injection 1 mg (1 mg Intramuscular Given 10/14/23 1245)  ketorolac  (TORADOL ) 15 MG/ML injection 15 mg (15 mg Intramuscular Given 10/14/23 1245)  acetaminophen  (TYLENOL ) tablet 1,000 mg (1,000 mg Oral Given 10/14/23 1244)  cyclobenzaprine  (FLEXERIL ) tablet 10 mg (10 mg Oral Given by Other 10/14/23 1244)                                                                                                                                     Procedures Procedures  (including critical care time)  Medical Decision Making / ED Course    Medical Decision Making:    Joe [name] is a 53 y.o. male  with past medical history as below, significant for A-fib, asthma who presents to the ED with complaint of back pain. The complaint involves an extensive differential diagnosis and also carries with it a high risk of complications and morbidity.  Serious etiology was considered. Ddx includes but is not limited to: Differential diagnosis includes but is not exclusive to musculoskeletal back pain, renal colic, urinary tract infection, pyelonephritis, intra-abdominal causes of back pain, aortic aneurysm or dissection, cauda equina syndrome, sciatica, lumbar disc disease, thoracic disc disease, etc.   Complete initial physical exam performed, notably the patient was in NAD, resting on stretcher.    Reviewed and confirmed nursing documentation for past medical history, family history, social history.  Vital signs reviewed.    Low back pain >        ***               Additional history obtained: -Additional history obtained from {wsadditionalhistorian:28072} -External records from outside source obtained and reviewed including: Chart review including previous notes, labs, imaging, consultation notes including  ***   Lab Tests: -I  ordered, reviewed, and interpreted labs.   The pertinent results include:   Labs Reviewed - No data to display  Notable for ***  EKG   EKG Interpretation Date/Time:    Ventricular Rate:    PR Interval:    QRS Duration:    QT Interval:    QTC Calculation:   R Axis:      Text Interpretation:           Imaging Studies ordered: I ordered imaging studies including *** I independently visualized the following imaging with scope of interpretation limited to determining acute life threatening conditions related to emergency care; findings noted above I agree with the radiologist interpretation If any imaging was obtained with contrast I closely monitored patient for any possible adverse reaction a/w contrast administration in the emergency department  Medicines ordered and prescription drug management: Meds ordered this encounter  Medications   HYDROmorphone  (DILAUDID ) injection 1 mg   ketorolac  (TORADOL ) 15 MG/ML injection 15 mg   acetaminophen  (TYLENOL ) tablet 1,000 mg   lidocaine  (LIDODERM ) 5 % 1 patch   cyclobenzaprine  (FLEXERIL ) tablet 10 mg    -I have reviewed the patients home medicines and have made adjustments as needed   Consultations Obtained: I requested consultation with the ***,  and discussed lab and imaging findings as well as pertinent plan - they recommend: ***   Cardiac Monitoring: The patient was maintained on a cardiac monitor.  I personally viewed and interpreted the cardiac monitored which showed an underlying rhythm of: *** Continuous pulse oximetry interpreted by myself, ***% on ***.    Social Determinants of Health:  Diagnosis or treatment significantly limited by social determinants of health: {wssoc:28071}   Reevaluation: After the interventions noted above, I reevaluated the patient and found that they have {resolved/improved/worsened:23923::improved}  Co morbidities that complicate the patient evaluation  Past Medical History:   Diagnosis Date   Asthma    as a child   Atrial fibrillation (HCC)    Dysrhythmia    Afib with RVR 01/21/2016   Hematuria    History of kidney stones    passed   Lung collapse    partial collasped lung   Pneumonia       Dispostion: Disposition decision including need for hospitalization was considered, and patient {wsdispo:28070::discharged from emergency department.}    Final Clinical Impression(s) / ED Diagnoses Final diagnoses:  None

## 2023-10-14 NOTE — ED Triage Notes (Signed)
 Pt seen here 7/24 for same complaint. Stated his meds aren't helping his back pain and is here for other pain management options

## 2023-10-14 NOTE — Discharge Instructions (Addendum)
 Please follow-up with your Primary Care Physician within the next week. Please take your medications as instructed and discuss any changes to your medications with your primary care physician.   Please follow up with spine specialists    Please return to the Emergency Department if you have any leg numbness, leg weakness, difficulty walking, fevers, worsening of pain, lightheadedness, lose consciousness, severe abdominal pain, severe headache, difficulty urinating, or difficulty having a bowel movement.   Please return to the emergency department immediately for any new or concerning symptoms, or if you get worse.
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
## 2023-12-05 ENCOUNTER — Other Ambulatory Visit: Payer: Self-pay

## 2023-12-05 ENCOUNTER — Ambulatory Visit (HOSPITAL_COMMUNITY): Attending: Adult Health

## 2023-12-05 ENCOUNTER — Encounter (HOSPITAL_COMMUNITY): Payer: Self-pay

## 2023-12-05 DIAGNOSIS — M25552 Pain in left hip: Secondary | ICD-10-CM | POA: Diagnosis present

## 2023-12-05 DIAGNOSIS — M6281 Muscle weakness (generalized): Secondary | ICD-10-CM | POA: Insufficient documentation

## 2023-12-05 DIAGNOSIS — M545 Low back pain, unspecified: Secondary | ICD-10-CM | POA: Insufficient documentation

## 2023-12-05 DIAGNOSIS — M5459 Other low back pain: Secondary | ICD-10-CM | POA: Insufficient documentation

## 2023-12-05 DIAGNOSIS — M79605 Pain in left leg: Secondary | ICD-10-CM | POA: Diagnosis present

## 2023-12-20 ENCOUNTER — Encounter (HOSPITAL_COMMUNITY): Payer: Self-pay

## 2023-12-20 ENCOUNTER — Encounter (HOSPITAL_COMMUNITY)

## 2023-12-20 ENCOUNTER — Ambulatory Visit (HOSPITAL_COMMUNITY): Attending: Adult Health

## 2023-12-20 DIAGNOSIS — M25552 Pain in left hip: Secondary | ICD-10-CM | POA: Insufficient documentation

## 2023-12-20 DIAGNOSIS — M79605 Pain in left leg: Secondary | ICD-10-CM | POA: Insufficient documentation

## 2023-12-20 DIAGNOSIS — M6281 Muscle weakness (generalized): Secondary | ICD-10-CM | POA: Insufficient documentation

## 2023-12-20 DIAGNOSIS — M545 Low back pain, unspecified: Secondary | ICD-10-CM | POA: Insufficient documentation

## 2023-12-20 DIAGNOSIS — M5459 Other low back pain: Secondary | ICD-10-CM | POA: Insufficient documentation
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
## 2023-12-25 ENCOUNTER — Telehealth (HOSPITAL_COMMUNITY): Payer: Self-pay

## 2023-12-25 ENCOUNTER — Encounter (HOSPITAL_COMMUNITY)

## 2023-12-25 NOTE — Telephone Encounter (Signed)
 No Show #1 Patient called concerning missed appointment prior to Therapist reaching out to patient. Spoke with Research officer, trade [employer]. Reports he forgot. Aware of next scheduled appointment on 01/08/24.   3:31 PM, 12/25/23 Joe [name], PT, DPT [facility] [place]

## 2024-01-08 ENCOUNTER — Encounter (HOSPITAL_COMMUNITY): Payer: Self-pay

## 2024-01-08 ENCOUNTER — Ambulatory Visit (HOSPITAL_COMMUNITY)

## 2024-01-08 DIAGNOSIS — M25552 Pain in left hip: Secondary | ICD-10-CM

## 2024-01-08 DIAGNOSIS — M545 Low back pain, unspecified: Secondary | ICD-10-CM

## 2024-01-08 DIAGNOSIS — M5459 Other low back pain: Secondary | ICD-10-CM | POA: Diagnosis not present

## 2024-01-08 DIAGNOSIS — M6281 Muscle weakness (generalized): Secondary | ICD-10-CM
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
## 2024-01-11 ENCOUNTER — Encounter (HOSPITAL_COMMUNITY)

## 2024-01-18 ENCOUNTER — Encounter (HOSPITAL_COMMUNITY): Payer: Self-pay

## 2024-01-18 ENCOUNTER — Ambulatory Visit (HOSPITAL_COMMUNITY)

## 2024-01-18 DIAGNOSIS — M5459 Other low back pain: Secondary | ICD-10-CM | POA: Diagnosis not present

## 2024-01-18 DIAGNOSIS — M25552 Pain in left hip: Secondary | ICD-10-CM

## 2024-01-18 DIAGNOSIS — M6281 Muscle weakness (generalized): Secondary | ICD-10-CM
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
## 2024-01-22 ENCOUNTER — Other Ambulatory Visit (HOSPITAL_COMMUNITY): Payer: Self-pay | Admitting: Adult Health

## 2024-01-22 DIAGNOSIS — F17210 Nicotine dependence, cigarettes, uncomplicated: Secondary | ICD-10-CM

## 2024-02-02 ENCOUNTER — Encounter (HOSPITAL_COMMUNITY): Payer: Self-pay

## 2024-02-02 ENCOUNTER — Ambulatory Visit (HOSPITAL_COMMUNITY): Attending: Adult Health

## 2024-02-05 ENCOUNTER — Ambulatory Visit (HOSPITAL_COMMUNITY)
